# Patient Record
Sex: Female | Born: 1970
Health system: Southern US, Community
[De-identification: ages and names within clinical notes are randomized; demographics above are authoritative.]

## PROBLEM LIST (undated history)

## (undated) DIAGNOSIS — E039 Hypothyroidism, unspecified: Secondary | ICD-10-CM

## (undated) DIAGNOSIS — F32A Depression, unspecified: Secondary | ICD-10-CM

## (undated) DIAGNOSIS — E079 Disorder of thyroid, unspecified: Secondary | ICD-10-CM

## (undated) DIAGNOSIS — S92351B Displaced fracture of fifth metatarsal bone, right foot, initial encounter for open fracture: Secondary | ICD-10-CM

## (undated) DIAGNOSIS — F419 Anxiety disorder, unspecified: Secondary | ICD-10-CM

## (undated) DIAGNOSIS — I10 Essential (primary) hypertension: Secondary | ICD-10-CM

## (undated) DIAGNOSIS — F329 Major depressive disorder, single episode, unspecified: Secondary | ICD-10-CM

## (undated) DIAGNOSIS — G4730 Sleep apnea, unspecified: Secondary | ICD-10-CM

## (undated) DIAGNOSIS — K219 Gastro-esophageal reflux disease without esophagitis: Secondary | ICD-10-CM

## (undated) HISTORY — PX: FOOT SURGERY: SHX648

## (undated) HISTORY — PX: SINUS IRRIGATION: SHX2411

## (undated) HISTORY — PX: LAPAROSCOPIC GASTRIC SLEEVE RESECTION: SHX5895

## (undated) HISTORY — PX: ABDOMINAL HYSTERECTOMY: SHX81

---

## 2016-01-21 NOTE — Progress Notes (Signed)
Surgical Weight Loss Program Consult Prep      Patient: Krystal Acevedo MRN: [id number]  SSN: [id number]    Date of Birth: [birth year]  Age: 45 y.o.  Sex: female      Need to address:  [facility]   Seeking sleeve gastrectomy procedure  Psychological Evaluation:   Krystal Leatherwood PhD. 12-22-15  Labs completed 04-09-15  6 month diet completed with PCP  She has an Ideal body weight of 155 lbs.      PCP: Boone Master , [place] and  requirements:  [facility], all requirements already met      The patient is followed by No primary care provider on file. for problems including:  Morbid Obesity  Sleep Apnea-yes uses CPAP-yes     Hypertension-yes How many medications-1  Hypothyroidism  Anxiety, panic attacks        PRIOR WEIGHT LOSS ATTEMPTS:  Phentermine, Weight Watchers, Atkins, MD supervised    EXERCISE ASSESSMENT:  Walking some    DENTAL ISSUES:  No issues with chewing food, one back right upper tooth is broken.    PSYCHOSOCIAL:  She notes she  is married and states main support person is her husband, Krystal Acevedo.  She is employed as a Therapist, music.  Her goal in pursuing surgical weight loss is to improve health.  She  reports appropriate treatment of anxiety.  She states she is independent in her care, can drive a car, and can ambulate without assistance.      HISTORY:  No past medical history on file.    She denies personal or family history of problems with anesthesia, bleeding, or clotting.  She denies history of DVT or pulmonary embolism.  She denies reflux or dysphagia.      No past surgical history on file.  Social History   Substance Use Topics   ??? Smoking status: Not on file   ??? Smokeless tobacco: Not on file   ??? Alcohol use Not on file     No family history on file.     MEDICATIONS:  No current outpatient prescriptions on file.     No current facility-administered medications for this visit.        ALLERGIES:  Allergies not on file        PLAN:    Consult for surgery.             New Patient PREOP Co-Morbid Condition Evaluation  Surgeon Ouida Sills      GENERAL   Diabetes Mellitus         No   Current Smoker w/in 1 year no   Functional Health Status Prior to Surgery Independent   PULMONARY   History of Severe COPD no   Oxygen Dependent no   History of Pulmonary Embolism no   Diagnosis of Obstructive Sleep Apnea yes   Req. CPAP / BiPAP yes   GASTROINTESTINAL   GERD req. Medications (w/in 30 days prior to surgery) no   MUSCULOSKELETAL   Is the patient's ambulation limited most or all of the time? no   Osteoarthritis req. medication no   CARDIAC   History of Myocardial Infarction no   Previous PCI / PTCA (Cardiac Cath / Angioplasty) no   Previous Cardiac Surgery no   Hypertension requiring medications yes   # of anti-hypertensive meds 1   Hyperlipidemia requiring medication no   VASCULAR   Vein Thrombosis req. Therapy no   Venous Stasis no   RENAL   Currently  requiring or on Dialysis no   Renal Insufficiency no   NUTRITIONAL / IMMUNE / ONCOLOGY / OTHER   Steroid / Immunosuppressant use for Chronic Condition no   Therapeutic Anticoagulation no   Previous Obesity Surgery / Foregut Surgery no       NOTES:

## 2016-02-02 ENCOUNTER — Ambulatory Visit: Admit: 2016-02-02 | Discharge: 2016-02-02 | Payer: PRIVATE HEALTH INSURANCE | Attending: Surgery | Primary: Surgery

## 2016-02-02 ENCOUNTER — Ambulatory Visit: Admit: 2016-02-02 | Discharge: 2016-02-02 | Primary: Surgery

## 2016-02-02 DIAGNOSIS — Z6841 Body Mass Index (BMI) 40.0 and over, adult: Secondary | ICD-10-CM

## 2016-02-02 DIAGNOSIS — Z713 Dietary counseling and surveillance: Secondary | ICD-10-CM
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
## 2016-02-02 NOTE — H&P (View-Only) (Signed)
History and Physical    Patient: Krystal Acevedo MRN: [id number]  SSN: [id number]    Date of Birth: 12-01-1970  Age: 45 y.o.  Sex: female              PCP:  Boone Master , MD  Date:  02/02/2016        Krystal Acevedo presents to the [facility] after successful completion of a multidisciplinary surgical prep program.  This is her consultation preparing her for sleeve gastrectomy surgery.  She is unaccompanied.  She presents with a height of 5' 5"  (1.651 m) and weight of 320 lb (145.2 kg), giving her a Body mass index is 53.25 kg/(m^2).[name]  She has an Ideal body weight of 155 lbs, and excess body weight of 165 lbs.       She has completed 6 months of nutrition, diet, exercise, and behavior modification with her PCP. She has completed all aspects of our prep program. She has also completed a psychological evaluation with Elberta Leatherwood PhD. 12-22-15, and an evaluation with our dietitian, Katie Horner, both deeming her an appropriate surgical candidate.  She completed our preoperative bariatric labs on 04-09-15, scanned in the Media tab.    We have reviewed the procedure again today and completed our standard pre op teaching. Her questions were answered and she is ready to schedule surgery.    We have discussed the procedure, diet and exercise regimens, and potential complications of surgery.  The patient understands the nature and potential complication of surgery and has the capacity to follow the post operative diet, exercise, and nutritional requirements.  After review, she has signed her surgical consent today.      PCP: Boone Master , MD  ??  Insurance company and requirements: [facility], all requirements already met  ??  ??  The patient is followed by No primary care provider on file. for problems including:  Morbid Obesity  Sleep Apnea-yes uses CPAP-yes   Hypertension-yes How many medications-1  Hypothyroidism  Anxiety, panic attacks  Depression  ??  ??She completed 6 months with [facility] Bariatric program with Dr. Melburn Popper.   Her insurance changed and she was not able to have surgery with their facility and is using the [facility] option through her insurance.   PRIOR WEIGHT LOSS ATTEMPTS: Phentermine, Weight Watchers, Orvan Seen, MD supervised  ??  EXERCISE ASSESSMENT: Walking some  ??  DENTAL ISSUES:  No issues with chewing food, one back right upper tooth is broken.  ??  PSYCHOSOCIAL:  She notes she is married and states main support person is her husband, [name].  He is very supportive and is also starting in the process to come to our program for SWL.   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  She reports appropriate treatment of anxiety. She states she is independent in her care, can drive a car, and can ambulate without assistance.  ??  ??  HISTORY:  Past Medical History:   Diagnosis Date   ??? Anxiety    ??? Depression    ??? History of MRSA infection    ??? Hypertension    ??? Hypothyroidism    ??? Sleep apnea        She denies personal or family history of problems with anesthesia, bleeding, or clotting.  She denies history of DVT or pulmonary embolism.  She denies reflux or dysphagia.      Past Surgical History:   Procedure Laterality  Date   ??? HX GYN      C-Sect 1996 and 2001   ??? HX HEENT      sinus surgery 2015   ??? HX HYSTERECTOMY      2007   ??? HX ORTHOPAEDIC      knee surgery 2011, toe surgery 203     Social History   Substance Use Topics   ??? Smoking status: Never Smoker   ??? Smokeless tobacco: Not on file   ??? Alcohol use 1.2 oz/week     2 Glasses of wine per week     Family History   Problem Relation Age of Onset   ??? Diabetes Maternal Grandmother    ??? Hypertension Maternal Grandmother         MEDICATIONS:  Current Outpatient Prescriptions   Medication Sig   ??? busPIRone (BUSPAR) 5 mg tablet Take  by mouth.   ??? hydroCHLOROthiazide (HYDRODIURIL) 25 mg tablet Take 25 mg by mouth daily.   ??? levothyroxine (SYNTHROID) 50 mcg tablet Take  by mouth Daily (before breakfast).    ??? venlafaxine-SR (EFFEXOR XR) 75 mg capsule Take  by mouth daily.   ??? clonazePAM (KLONOPIN) 0.5 mg tablet Take  by mouth nightly as needed.     No current facility-administered medications for this visit.        ALLERGIES:  Allergies   Allergen Reactions   ??? Sulfa (Sulfonamide Antibiotics) Hives       ROS: The patient has no difficulty with chest pain or shortness of breath.  No fever or chills.  Comprehensive 13 point review of systems was otherwise unremarkable except as noted above.    PHYSICAL EXAM:   Visit Vitals   ??? BP 125/61   ??? Pulse 81   ??? Ht 5' 5"  (1.651 m)   ??? Wt 320 lb (145.2 kg)   ??? BMI 53.25 kg/m2       General: Alert oriented cooperative patient in no acute distress.   Eyes: Sclera are clear. Conjunctiva and lids within normal limits. No icterus.  Ears and Nose: no gross deformities to visual inspection, gross hearing intact  Neck: Supple, trachea midline, no appreciable thyromegaly  Resp: No JVD.  Breathing is  non-labored. Lungs clear to auscultation without wheezing or rhonchi   CV: RRR. No murmurs, rubs or gallops appreciated, Carotid bruits are absent  Abd: soft non-tender and non-distended without peritoneal signs. +bs    Psych:  Mood and affect appropriate.  Short-term memory and understanding intact      ASSESSMENT:  Morbid obesity with a  Body mass index is 53.25 kg/(m^2). ready for sleeve gastrectomy surgery.    PLAN:  1.  Schedule for laparoscopic, possible open, sleeve gastrectomy, in the near future.  2.  Patient will complete our 2 week pre operative diet.  3.  Bring CPAP and incentive spirometer( given with our standard instruction to use BID 2 weeks prior to surgery) to hospital on day of surgery.  4.  The patient received prescription to use after surgery for :  Percocet, Zofran, and Protonix.    I went over the risks and benefits with the patient. For risks I went over problems with bleeding, infection and anesthesia. I also noted potential  problems of injury to the esophagus, liver, spleen, stomach, pancreas, small bowel and or colon. I addition, I discussed potential problems of DVT/pulmonary embolism, urinary tract infection, wound infection, myocardial infarction, stroke and the potential need to convert to an open procedure. I quoted  a 1% nationwide mortality rate for this procedure. She has signed our extensive consent form which is in the chart.       Fredna Dow, MD    Date:  02/02/2016

## 2016-02-02 NOTE — Progress Notes (Signed)
History and Physical    Patient: Krystal Acevedo MRN: [id number]  SSN: [id number]    Date of Birth: April 14, 1971  Age: 45 y.o.  Sex: female              PCP:  Boone Master , MD  Date:  02/02/2016        Krystal Acevedo presents to the [facility] after successful completion of a multidisciplinary surgical prep program.  This is her consultation preparing her for sleeve gastrectomy surgery.  She is unaccompanied.  She presents with a height of 5' 5"  (1.651 m) and weight of 320 lb (145.2 kg), giving her a Body mass index is 53.25 kg/(m^2).[name]  She has an Ideal body weight of 155 lbs, and excess body weight of 165 lbs.       She has completed 6 months of nutrition, diet, exercise, and behavior modification with her PCP. She has completed all aspects of our prep program. She has also completed a psychological evaluation with Elberta Leatherwood PhD. 12-22-15, and an evaluation with our dietitian, Katie Horner, both deeming her an appropriate surgical candidate.  She completed our preoperative bariatric labs on 04-09-15, scanned in the Media tab.    We have reviewed the procedure again today and completed our standard pre op teaching. Her questions were answered and she is ready to schedule surgery.    We have discussed the procedure, diet and exercise regimens, and potential complications of surgery.  The patient understands the nature and potential complication of surgery and has the capacity to follow the post operative diet, exercise, and nutritional requirements.  After review, she has signed her surgical consent today.      PCP: Boone Master , MD  ??  Insurance company and requirements: [facility], all requirements already met  ??  ??  The patient is followed by No primary care provider on file. for problems including:  Morbid Obesity  Sleep Apnea-yes uses CPAP-yes   Hypertension-yes How many medications-1  Hypothyroidism  Anxiety, panic attacks  Depression  ??  ??She completed 6 months with [facility] Bariatric program with Dr. Melburn Popper.   Her insurance changed and she was not able to have surgery with their facility and is using the [place] option through her insurance.   PRIOR WEIGHT LOSS ATTEMPTS: Phentermine, Weight Watchers, Orvan Seen, MD supervised  ??  EXERCISE ASSESSMENT: Walking some  ??  DENTAL ISSUES:  No issues with chewing food, one back right upper tooth is broken.  ??  PSYCHOSOCIAL:  She notes she is married and states main support person is her husband, [name].  He is very supportive and is also starting in the process to come to our program for SWL.   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  She reports appropriate treatment of anxiety. She states she is independent in her care, can drive a car, and can ambulate without assistance.  ??  ??  HISTORY:  Past Medical History:   Diagnosis Date   ??? Anxiety    ??? Depression    ??? History of MRSA infection    ??? Hypertension    ??? Hypothyroidism    ??? Sleep apnea        She denies personal or family history of problems with anesthesia, bleeding, or clotting.  She denies history of DVT or pulmonary embolism.  She denies reflux or dysphagia.      Past Surgical History:   Procedure Laterality  Date   ??? HX GYN      C-Sect 1996 and 2001   ??? HX HEENT      sinus surgery 2015   ??? HX HYSTERECTOMY      2007   ??? HX ORTHOPAEDIC      knee surgery 2011, toe surgery 203     Social History   Substance Use Topics   ??? Smoking status: Never Smoker   ??? Smokeless tobacco: Not on file   ??? Alcohol use 1.2 oz/week     2 Glasses of wine per week     Family History   Problem Relation Age of Onset   ??? Diabetes Maternal Grandmother    ??? Hypertension Maternal Grandmother         MEDICATIONS:  Current Outpatient Prescriptions   Medication Sig   ??? busPIRone (BUSPAR) 5 mg tablet Take  by mouth.   ??? hydroCHLOROthiazide (HYDRODIURIL) 25 mg tablet Take 25 mg by mouth daily.   ??? levothyroxine (SYNTHROID) 50 mcg tablet Take  by mouth Daily (before breakfast).    ??? venlafaxine-SR (EFFEXOR XR) 75 mg capsule Take  by mouth daily.   ??? clonazePAM (KLONOPIN) 0.5 mg tablet Take  by mouth nightly as needed.     No current facility-administered medications for this visit.        ALLERGIES:  Allergies   Allergen Reactions   ??? Sulfa (Sulfonamide Antibiotics) Hives       ROS: The patient has no difficulty with chest pain or shortness of breath.  No fever or chills.  Comprehensive 13 point review of systems was otherwise unremarkable except as noted above.    PHYSICAL EXAM:   Visit Vitals   ??? BP 125/61   ??? Pulse 81   ??? Ht 5' 5"  (1.651 m)   ??? Wt 320 lb (145.2 kg)   ??? BMI 53.25 kg/m2       General: Alert oriented cooperative patient in no acute distress.   Eyes: Sclera are clear. Conjunctiva and lids within normal limits. No icterus.  Ears and Nose: no gross deformities to visual inspection, gross hearing intact  Neck: Supple, trachea midline, no appreciable thyromegaly  Resp: No JVD.  Breathing is  non-labored. Lungs clear to auscultation without wheezing or rhonchi   CV: RRR. No murmurs, rubs or gallops appreciated, Carotid bruits are absent  Abd: soft non-tender and non-distended without peritoneal signs. +bs    Psych:  Mood and affect appropriate.  Short-term memory and understanding intact      ASSESSMENT:  Morbid obesity with a  Body mass index is 53.25 kg/(m^2). ready for sleeve gastrectomy surgery.    PLAN:  1.  Schedule for laparoscopic, possible open, sleeve gastrectomy, in the near future.  2.  Patient will complete our 2 week pre operative diet.  3.  Bring CPAP and incentive spirometer( given with our standard instruction to use BID 2 weeks prior to surgery) to hospital on day of surgery.  4.  The patient received prescription to use after surgery for :  Percocet, Zofran, and Protonix.    I went over the risks and benefits with the patient. For risks I went over problems with bleeding, infection and anesthesia. I also noted potential  problems of injury to the esophagus, liver, spleen, stomach, pancreas, small bowel and or colon. I addition, I discussed potential problems of DVT/pulmonary embolism, urinary tract infection, wound infection, myocardial infarction, stroke and the potential need to convert to an open procedure. I quoted  a 1% nationwide mortality rate for this procedure. She has signed our extensive consent form which is in the chart.       Fredna Dow, MD    Date:  02/02/2016

## 2016-02-11 ENCOUNTER — Encounter: Attending: Surgery | Primary: Surgery

## 2016-02-11 ENCOUNTER — Encounter: Primary: Surgery

## 2016-02-12 ENCOUNTER — Inpatient Hospital Stay: Admit: 2016-02-12 | Payer: PRIVATE HEALTH INSURANCE | Attending: Surgery | Primary: Surgery

## 2016-02-12 DIAGNOSIS — Z01812 Encounter for preprocedural laboratory examination: Secondary | ICD-10-CM

## 2016-02-12 LAB — METABOLIC PANEL, COMPREHENSIVE
A-G Ratio: 0.9 — ABNORMAL LOW (ref 1.2–3.5)
ALT (SGPT): 42 U/L (ref 12–65)
AST (SGOT): 21 U/L (ref 15–37)
Albumin: 3.7 g/dL (ref 3.5–5.0)
Alk. phosphatase: 85 U/L (ref 50–136)
Anion gap: 8 mmol/L (ref 7–16)
BUN: 22 MG/DL (ref 6–23)
Bilirubin, total: 0.5 MG/DL (ref 0.2–1.1)
CO2: 29 mmol/L (ref 21–32)
Calcium: 9.1 MG/DL (ref 8.3–10.4)
Chloride: 103 mmol/L (ref 98–107)
Creatinine: 1.08 MG/DL — ABNORMAL HIGH (ref 0.6–1.0)
GFR est AA: >60 mL/min/{1.73_m2} (ref >60)
GFR est non-AA: 58 mL/min/{1.73_m2} — ABNORMAL LOW (ref >60)
Globulin: 3.9 g/dL — ABNORMAL HIGH (ref 2.3–3.5)
Glucose: 91 mg/dL (ref 65–100)
Potassium: 4.4 mmol/L (ref 3.5–5.1)
Protein, total: 7.6 g/dL (ref 6.3–8.2)
Sodium: 140 mmol/L (ref 136–145)

## 2016-02-12 LAB — CBC W/O DIFF
HCT: 42.5 % (ref 35.8–46.3)
HGB: 14 g/dL (ref 11.7–15.4)
MCH: 30.2 PG (ref 26.1–32.9)
MCHC: 32.9 g/dL (ref 31.4–35.0)
MCV: 91.8 FL (ref 79.6–97.8)
MPV: 9.8 FL — ABNORMAL LOW (ref 10.8–14.1)
PLATELET: 297 10*3/uL (ref 150–450)
RBC: 4.63 M/uL (ref 4.05–5.25)
RDW: 14.6 % (ref 11.9–14.6)
WBC: 7.9 10*3/uL (ref 4.3–11.1)

## 2016-02-12 NOTE — Other (Signed)
Lab results are within limits per anesthesia protocol.

## 2016-02-12 NOTE — Other (Signed)
Patient verified name, DOB, and surgery as listed in [facility].    Dr Carollee Leitz, anesthesiologist notified of heart murmur heard.  He met with pt and listened to heart sounds.  He reported to pt that  murmur is mild, no need for echo prior to surgery.    Type 2 surgery, PAT assessment complete.    Labs per surgeon: cbc,cmp; results pending.    EKG: not doone today per anesthesia guidelines.    Hibiclens and instructions given per hospital policy.    Patient provided with handouts including Guide to Surgery, Pain Management, Hand Hygiene, Blood Transfusion Education, and Corporate treasurer.    Patient answered medical/surgical history questions at their best of ability. All prior to admission medications documented in [facility]. Original medication prescription bottle not visualized during patient appointment.     Patient instructed to hold all vitamins 7 days prior to surgery and NSAIDS 5 days prior to surgery, patient verbalized understanding. Medications to be held : none.    Patient instructed to continue previous medications as prescribed prior to surgery and to take the following medications the day of surgery according to anesthesia guidelines with a small sip of water: clonazepam if needed, levothyroxine, buspar, effexor.     Patient taught back and verbalized understanding.

## 2016-02-12 NOTE — Anesthesia Pre-Procedure Evaluation (Signed)
Anesthetic History               Review of Systems / Medical History  Patient summary reviewed, nursing notes reviewed and pertinent labs reviewed    Pulmonary        Sleep apnea: CPAP           Neuro/Psych              Cardiovascular    Hypertension: well controlled              Exercise tolerance:: Walks up and down stairwell to 3rd floor apartment multiple times daily w/o symptoms other than moderate SOB, which resolves quickly     GI/Hepatic/Renal                Endo/Other      Hypothyroidism: well controlled  Morbid obesity     Other Findings              Physical Exam    Airway  Mallampati: I  TM Distance: 4 - 6 cm  Neck ROM: normal range of motion   Mouth opening: Normal     Cardiovascular    Rhythm: regular  Rate: normal    Murmur: Grade 2, Aortic area     Dental  No notable dental hx    Comments: 1 capped upper incisor   Pulmonary  Breath sounds clear to auscultation               Abdominal  GI exam deferred       Other Findings            Anesthetic Plan    ASA: 3  Anesthesia type: general            Anesthetic plan and risks discussed with: Patient and Spouse      In light of pt's excellent FS (despite morbid obesity), I feel there is no compelling indication to investigate cardiac murmur further at this time.

## 2016-02-22 ENCOUNTER — Inpatient Hospital Stay
Admit: 2016-02-22 | Discharge: 2016-02-24 | Disposition: A | Discharge status: Home / Self Care | Payer: PRIVATE HEALTH INSURANCE | Source: Ambulatory Visit | Attending: Surgery | Admitting: Surgery

## 2016-02-22 MED ORDER — SODIUM CHLORIDE 0.9 % IJ SYRG
INTRAMUSCULAR | Status: DC | PRN
Start: 2016-02-22 — End: 2016-02-22

## 2016-02-22 MED ORDER — WHITE PETROLATUM TOPICAL GEL
CUTANEOUS | Status: AC
Start: 2016-02-22 — End: ?

## 2016-02-22 MED ORDER — BUPIVACAINE (PF) 0.5 % (5 MG/ML) IJ SOLN
0.5 % (5 mg/mL) | INTRAMUSCULAR | Status: DC | PRN
Start: 2016-02-22 — End: 2016-02-22
  Administered 2016-02-22: 16:00:00 via SUBCUTANEOUS

## 2016-02-22 MED ORDER — DEXTROSE 50% IN WATER (D50W) IV SYRG
INTRAVENOUS | Status: DC | PRN
Start: 2016-02-22 — End: 2016-02-24

## 2016-02-22 MED ORDER — FENTANYL CITRATE (PF) 50 MCG/ML IJ SOLN
50 mcg/mL | Freq: Once | INTRAMUSCULAR | Status: DC
Start: 2016-02-22 — End: 2016-02-22

## 2016-02-22 MED ORDER — BUPIVACAINE (PF) 0.5 % (5 MG/ML) IJ SOLN
0.5 % (5 mg/mL) | INTRAMUSCULAR | Status: AC
Start: 2016-02-22 — End: ?

## 2016-02-22 MED ORDER — MIDAZOLAM 1 MG/ML IJ SOLN
1 mg/mL | INTRAMUSCULAR | Status: AC
Start: 2016-02-22 — End: ?

## 2016-02-22 MED ORDER — DEXAMETHASONE SODIUM PHOSPHATE 10 MG/ML IJ SOLN
10 mg/mL | INTRAMUSCULAR | Status: AC
Start: 2016-02-22 — End: ?

## 2016-02-22 MED ORDER — KETOROLAC TROMETHAMINE 30 MG/ML INJECTION
30 mg/mL (1 mL) | INTRAMUSCULAR | Status: AC
Start: 2016-02-22 — End: ?

## 2016-02-22 MED ORDER — LIDOCAINE (PF) 20 MG/ML (2 %) IV SYRINGE
100 mg/5 mL (2 %) | INTRAVENOUS | Status: AC
Start: 2016-02-22 — End: ?

## 2016-02-22 MED ORDER — VECURONIUM BROMIDE 10 MG IV SOLR
10 mg | INTRAVENOUS | Status: DC | PRN
Start: 2016-02-22 — End: 2016-02-22
  Administered 2016-02-22: 15:00:00 via INTRAVENOUS

## 2016-02-22 MED ORDER — SODIUM CHLORIDE 0.9% BOLUS IV
0.9 % | INTRAVENOUS | Status: DC | PRN
Start: 2016-02-22 — End: 2016-02-24

## 2016-02-22 MED ORDER — ONDANSETRON (PF) 4 MG/2 ML INJECTION
4 mg/2 mL | INTRAMUSCULAR | Status: DC | PRN
Start: 2016-02-22 — End: 2016-02-22
  Administered 2016-02-22: 16:00:00 via INTRAVENOUS

## 2016-02-22 MED ORDER — PANTOPRAZOLE 40 MG IV SOLR
40 mg | INTRAVENOUS | Status: DC
Start: 2016-02-22 — End: 2016-02-23
  Administered 2016-02-22: 18:00:00 via INTRAVENOUS

## 2016-02-22 MED ORDER — NS WITH POTASSIUM CHLORIDE 20 MEQ/L IV
20 mEq/L | INTRAVENOUS | Status: DC
Start: 2016-02-22 — End: 2016-02-24
  Administered 2016-02-22 – 2016-02-24 (×4): via INTRAVENOUS

## 2016-02-22 MED ORDER — LIDOCAINE (PF) 20 MG/ML (2 %) IJ SOLN
20 mg/mL (2 %) | INTRAMUSCULAR | Status: DC | PRN
Start: 2016-02-22 — End: 2016-02-22
  Administered 2016-02-22: 15:00:00 via INTRAVENOUS

## 2016-02-22 MED ORDER — LACTATED RINGERS IV
INTRAVENOUS | Status: DC
Start: 2016-02-22 — End: 2016-02-22
  Administered 2016-02-22 (×2): via INTRAVENOUS

## 2016-02-22 MED ORDER — GLYCOPYRROLATE 0.2 MG/ML IJ SOLN
0.2 mg/mL | INTRAMUSCULAR | Status: AC
Start: 2016-02-22 — End: ?

## 2016-02-22 MED ORDER — ENOXAPARIN 40 MG/0.4 ML SUB-Q SYRINGE
40 mg/0.4 mL | Freq: Once | SUBCUTANEOUS | Status: AC
Start: 2016-02-22 — End: 2016-02-22
  Administered 2016-02-22: 14:00:00 via SUBCUTANEOUS

## 2016-02-22 MED ORDER — VECURONIUM BROMIDE 10 MG IV SOLR
10 mg | INTRAVENOUS | Status: AC
Start: 2016-02-22 — End: ?

## 2016-02-22 MED ORDER — NALOXONE 0.4 MG/ML INJECTION
0.4 mg/mL | INTRAMUSCULAR | Status: DC | PRN
Start: 2016-02-22 — End: 2016-02-24

## 2016-02-22 MED ORDER — SODIUM CHLORIDE 0.9 % IJ SYRG
INTRAMUSCULAR | Status: DC | PRN
Start: 2016-02-22 — End: 2016-02-24

## 2016-02-22 MED ORDER — ENOXAPARIN 40 MG/0.4 ML SUB-Q SYRINGE
40 mg/0.4 mL | Freq: Two times a day (BID) | SUBCUTANEOUS | Status: DC
Start: 2016-02-22 — End: 2016-02-24
  Administered 2016-02-23 – 2016-02-24 (×4): via SUBCUTANEOUS

## 2016-02-22 MED ORDER — SODIUM CHLORIDE 0.9 % IV PIGGY BACK
600 mg/4 mL | Freq: Three times a day (TID) | INTRAVENOUS | Status: AC
Start: 2016-02-22 — End: 2016-02-23
  Administered 2016-02-22 – 2016-02-23 (×2): via INTRAVENOUS

## 2016-02-22 MED ORDER — HYDROMORPHONE (PF) 2 MG/ML IJ SOLN
2 mg/mL | INTRAMUSCULAR | Status: DC | PRN
Start: 2016-02-22 — End: 2016-02-22
  Administered 2016-02-22 (×2): via INTRAVENOUS

## 2016-02-22 MED ORDER — KETOROLAC TROMETHAMINE 30 MG/ML INJECTION
30 mg/mL (1 mL) | Freq: Four times a day (QID) | INTRAMUSCULAR | Status: DC | PRN
Start: 2016-02-22 — End: 2016-02-24

## 2016-02-22 MED ORDER — APREPITANT 40 MG CAP
40 mg | Freq: Once | ORAL | Status: AC
Start: 2016-02-22 — End: 2016-02-22
  Administered 2016-02-22: 14:00:00 via ORAL

## 2016-02-22 MED ORDER — DIPHENHYDRAMINE HCL 50 MG/ML IJ SOLN
50 mg/mL | INTRAMUSCULAR | Status: DC | PRN
Start: 2016-02-22 — End: 2016-02-24

## 2016-02-22 MED ORDER — DEXTROSE 40 % ORAL GEL
40 % | ORAL | Status: DC | PRN
Start: 2016-02-22 — End: 2016-02-24

## 2016-02-22 MED ORDER — SODIUM CHLORIDE 0.9 % INJECTION
25 mg/mL | Freq: Once | INTRAMUSCULAR | Status: AC
Start: 2016-02-22 — End: 2016-02-22
  Administered 2016-02-22: 17:00:00 via INTRAVENOUS

## 2016-02-22 MED ORDER — LIDOCAINE HCL 1 % (10 MG/ML) IJ SOLN
10 mg/mL (1 %) | INTRAMUSCULAR | Status: DC | PRN
Start: 2016-02-22 — End: 2016-02-22
  Administered 2016-02-22: 14:00:00 via SUBCUTANEOUS

## 2016-02-22 MED ORDER — DEXAMETHASONE SODIUM PHOSPHATE 4 MG/ML IJ SOLN
4 mg/mL | INTRAMUSCULAR | Status: DC | PRN
Start: 2016-02-22 — End: 2016-02-22
  Administered 2016-02-22: 16:00:00 via INTRAVENOUS

## 2016-02-22 MED ORDER — ONDANSETRON (PF) 4 MG/2 ML INJECTION
4 mg/2 mL | Freq: Four times a day (QID) | INTRAMUSCULAR | Status: DC | PRN
Start: 2016-02-22 — End: 2016-02-24
  Administered 2016-02-22 – 2016-02-23 (×2): via INTRAVENOUS

## 2016-02-22 MED ORDER — NEOSTIGMINE METHYLSULFATE 3 MG/3 ML (1 MG/ML) IV SYRINGE
3 mg/ mL (1 mg/mL) | INTRAVENOUS | Status: AC
Start: 2016-02-22 — End: ?

## 2016-02-22 MED ORDER — CEFAZOLIN 3G IN 100 ML 0.9% NS PREMIX
3 gram/100ml | Freq: Once | INTRAVENOUS | Status: AC
Start: 2016-02-22 — End: 2016-02-22
  Administered 2016-02-22: 15:00:00 via INTRAVENOUS

## 2016-02-22 MED ORDER — LACTATED RINGERS BOLUS IV
Freq: Once | INTRAVENOUS | Status: DC | PRN
Start: 2016-02-22 — End: 2016-02-22

## 2016-02-22 MED ORDER — LORAZEPAM 2 MG/ML IJ SOLN
2 mg/mL | Freq: Four times a day (QID) | INTRAMUSCULAR | Status: DC | PRN
Start: 2016-02-22 — End: 2016-02-24

## 2016-02-22 MED ORDER — GLUCAGON 1 MG INJECTION
1 mg | INTRAMUSCULAR | Status: DC | PRN
Start: 2016-02-22 — End: 2016-02-24

## 2016-02-22 MED ORDER — ACETAMINOPHEN 1,000 MG/100 ML (10 MG/ML) IV
1000 mg/100 mL (10 mg/mL) | Freq: Four times a day (QID) | INTRAVENOUS | Status: AC
Start: 2016-02-22 — End: 2016-02-23
  Administered 2016-02-22 – 2016-02-23 (×3): via INTRAVENOUS

## 2016-02-22 MED ORDER — FENTANYL CITRATE (PF) 50 MCG/ML IJ SOLN
50 mcg/mL | INTRAMUSCULAR | Status: DC | PRN
Start: 2016-02-22 — End: 2016-02-22
  Administered 2016-02-22 (×2): via INTRAVENOUS

## 2016-02-22 MED ORDER — DIPHENHYDRAMINE HCL 50 MG/ML IJ SOLN
50 mg/mL | Freq: Once | INTRAMUSCULAR | Status: DC
Start: 2016-02-22 — End: 2016-02-22
  Administered 2016-02-22: 17:00:00 via INTRAVENOUS

## 2016-02-22 MED ORDER — ACETAMINOPHEN 1,000 MG/100 ML (10 MG/ML) IV
1000 mg/100 mL (10 mg/mL) | Freq: Once | INTRAVENOUS | Status: AC
Start: 2016-02-22 — End: 2016-02-22
  Administered 2016-02-22: 16:00:00 via INTRAVENOUS

## 2016-02-22 MED ORDER — NEOSTIGMINE METHYLSULFATE 1 MG/ML INJECTION
1 mg/mL | INTRAMUSCULAR | Status: DC | PRN
Start: 2016-02-22 — End: 2016-02-22
  Administered 2016-02-22: 16:00:00 via INTRAVENOUS

## 2016-02-22 MED ORDER — PROPOFOL 10 MG/ML IV EMUL
10 mg/mL | INTRAVENOUS | Status: AC
Start: 2016-02-22 — End: ?

## 2016-02-22 MED ORDER — MIDAZOLAM 1 MG/ML IJ SOLN
1 mg/mL | Freq: Once | INTRAMUSCULAR | Status: AC
Start: 2016-02-22 — End: 2016-02-22
  Administered 2016-02-22: 15:00:00 via INTRAVENOUS

## 2016-02-22 MED ORDER — SODIUM CHLORIDE 0.9 % IV
INTRAVENOUS | Status: DC
Start: 2016-02-22 — End: 2016-02-22

## 2016-02-22 MED ORDER — MIDAZOLAM 1 MG/ML IJ SOLN
1 mg/mL | Freq: Once | INTRAMUSCULAR | Status: DC | PRN
Start: 2016-02-22 — End: 2016-02-22

## 2016-02-22 MED ORDER — GLYCOPYRROLATE 0.2 MG/ML IJ SOLN
0.2 mg/mL | INTRAMUSCULAR | Status: DC | PRN
Start: 2016-02-22 — End: 2016-02-22
  Administered 2016-02-22 (×2): via INTRAVENOUS

## 2016-02-22 MED ORDER — HYDROMORPHONE (PF) 1 MG/ML IJ SOLN
1 mg/mL | INTRAMUSCULAR | Status: DC | PRN
Start: 2016-02-22 — End: 2016-02-24
  Administered 2016-02-22 – 2016-02-24 (×4): via INTRAVENOUS

## 2016-02-22 MED ORDER — PROPOFOL 10 MG/ML IV EMUL
10 mg/mL | INTRAVENOUS | Status: DC | PRN
Start: 2016-02-22 — End: 2016-02-22
  Administered 2016-02-22: 15:00:00 via INTRAVENOUS

## 2016-02-22 MED ORDER — OXYCODONE-ACETAMINOPHEN 5 MG-325 MG TAB
5-325 mg | ORAL | Status: DC | PRN
Start: 2016-02-22 — End: 2016-02-22

## 2016-02-22 MED ORDER — FAMOTIDINE 20 MG TAB
20 mg | Freq: Once | ORAL | Status: AC
Start: 2016-02-22 — End: 2016-02-22
  Administered 2016-02-22: 14:00:00 via ORAL

## 2016-02-22 MED ORDER — HYDROMORPHONE (PF) 1 MG/ML IJ SOLN
1 mg/mL | INTRAMUSCULAR | Status: DC | PRN
Start: 2016-02-22 — End: 2016-02-24
  Administered 2016-02-23: 13:00:00 via INTRAVENOUS

## 2016-02-22 MED ORDER — ENALAPRILAT 1.25 MG/ML IV INJ
1.25 mg/mL | INTRAVENOUS | Status: DC | PRN
Start: 2016-02-22 — End: 2016-02-24

## 2016-02-22 MED ORDER — OXYCODONE 5 MG TAB
5 mg | Freq: Once | ORAL | Status: DC | PRN
Start: 2016-02-22 — End: 2016-02-22

## 2016-02-22 MED ORDER — SODIUM CHLORIDE 0.9 % IJ SYRG
Freq: Three times a day (TID) | INTRAMUSCULAR | Status: DC
Start: 2016-02-22 — End: 2016-02-22

## 2016-02-22 MED ORDER — SODIUM CHLORIDE 0.9 % IJ SYRG
Freq: Three times a day (TID) | INTRAMUSCULAR | Status: DC
Start: 2016-02-22 — End: 2016-02-24
  Administered 2016-02-22 – 2016-02-24 (×6): via INTRAVENOUS

## 2016-02-22 MED ORDER — METOPROLOL TARTRATE 5 MG/5 ML IV SOLN
5 mg/ mL | INTRAVENOUS | Status: DC | PRN
Start: 2016-02-22 — End: 2016-02-24

## 2016-02-22 MED ORDER — KETOROLAC TROMETHAMINE 30 MG/ML INJECTION
30 mg/mL (1 mL) | INTRAMUSCULAR | Status: DC | PRN
Start: 2016-02-22 — End: 2016-02-22
  Administered 2016-02-22: 16:00:00 via INTRAVENOUS

## 2016-02-22 MED ORDER — ONDANSETRON (PF) 4 MG/2 ML INJECTION
4 mg/2 mL | Freq: Once | INTRAMUSCULAR | Status: DC
Start: 2016-02-22 — End: 2016-02-22

## 2016-02-22 MED ORDER — ONDANSETRON (PF) 4 MG/2 ML INJECTION
4 mg/2 mL | INTRAMUSCULAR | Status: AC
Start: 2016-02-22 — End: ?

## 2016-02-22 MED ORDER — VENLAFAXINE SR 37.5 MG 24 HR CAP
37.5 mg | Freq: Every day | ORAL | Status: DC
Start: 2016-02-22 — End: 2016-02-24
  Administered 2016-02-23 – 2016-02-24 (×2): via ORAL

## 2016-02-22 MED ORDER — FENTANYL CITRATE (PF) 50 MCG/ML IJ SOLN
50 mcg/mL | INTRAMUSCULAR | Status: AC
Start: 2016-02-22 — End: ?

## 2016-02-22 MED ORDER — LACTATED RINGERS IV
INTRAVENOUS | Status: DC
Start: 2016-02-22 — End: 2016-02-22
  Administered 2016-02-22: 17:00:00 via INTRAVENOUS

## 2016-02-22 MED FILL — GLYCOPYRROLATE 0.2 MG/ML IJ SOLN: 0.2 mg/mL | INTRAMUSCULAR | Qty: 2

## 2016-02-22 MED FILL — WHITE PETROLATUM TOPICAL GEL: CUTANEOUS | Qty: 30

## 2016-02-22 MED FILL — PROPOFOL 10 MG/ML IV EMUL: 10 mg/mL | INTRAVENOUS | Qty: 200

## 2016-02-22 MED FILL — VECURONIUM BROMIDE 10 MG IV SOLR: 10 mg | INTRAVENOUS | Qty: 8

## 2016-02-22 MED FILL — KETOROLAC TROMETHAMINE 30 MG/ML INJECTION: 30 mg/mL (1 mL) | INTRAMUSCULAR | Qty: 1

## 2016-02-22 MED FILL — HYDROMORPHONE (PF) 1 MG/ML IJ SOLN: 1 mg/mL | INTRAMUSCULAR | Qty: 1

## 2016-02-22 MED FILL — DEXAMETHASONE SODIUM PHOSPHATE 10 MG/ML IJ SOLN: 10 mg/mL | INTRAMUSCULAR | Qty: 1

## 2016-02-22 MED FILL — EMEND 40 MG CAPSULE: 40 mg | ORAL | Qty: 1

## 2016-02-22 MED FILL — NEOSTIGMINE METHYLSULFATE 3 MG/3 ML (1 MG/ML) IV SYRINGE: 3 mg/ mL (1 mg/mL) | INTRAVENOUS | Qty: 3

## 2016-02-22 MED FILL — MIDAZOLAM 1 MG/ML IJ SOLN: 1 mg/mL | INTRAMUSCULAR | Qty: 2

## 2016-02-22 MED FILL — ONDANSETRON (PF) 4 MG/2 ML INJECTION: 4 mg/2 mL | INTRAMUSCULAR | Qty: 2

## 2016-02-22 MED FILL — FENTANYL CITRATE (PF) 50 MCG/ML IJ SOLN: 50 mcg/mL | INTRAMUSCULAR | Qty: 2

## 2016-02-22 MED FILL — VECURONIUM BROMIDE 10 MG IV SOLR: 10 mg | INTRAVENOUS | Qty: 10

## 2016-02-22 MED FILL — CEFAZOLIN 3G IN 100 ML 0.9% NS PREMIX: 3 gram/100ml | INTRAVENOUS | Qty: 100

## 2016-02-22 MED FILL — CLINDAMYCIN 600 MG/4 ML IV: 600 mg/4 mL | INTRAVENOUS | Qty: 4

## 2016-02-22 MED FILL — DEXAMETHASONE SODIUM PHOSPHATE 10 MG/ML IJ SOLN: 10 mg/mL | INTRAMUSCULAR | Qty: 10

## 2016-02-22 MED FILL — HYDROMORPHONE (PF) 2 MG/ML IJ SOLN: 2 mg/mL | INTRAMUSCULAR | Qty: 1

## 2016-02-22 MED FILL — LIDOCAINE (PF) 20 MG/ML (2 %) IV SYRINGE: 100 mg/5 mL (2 %) | INTRAVENOUS | Qty: 5

## 2016-02-22 MED FILL — SENSORCAINE-MPF 0.5 % (5 MG/ML) INJECTION SOLUTION: 0.5 % (5 mg/mL) | INTRAMUSCULAR | Qty: 30

## 2016-02-22 MED FILL — OFIRMEV 1,000 MG/100 ML (10 MG/ML) INTRAVENOUS SOLUTION: 1000 mg/100 mL (10 mg/mL) | INTRAVENOUS | Qty: 100

## 2016-02-22 MED FILL — FAMOTIDINE 20 MG TAB: 20 mg | ORAL | Qty: 1

## 2016-02-22 MED FILL — PROPOFOL 10 MG/ML IV EMUL: 10 mg/mL | INTRAVENOUS | Qty: 20

## 2016-02-22 MED FILL — XYLOCAINE-MPF 20 MG/ML (2 %) INJECTION SOLUTION: 20 mg/mL (2 %) | INTRAMUSCULAR | Qty: 100

## 2016-02-22 MED FILL — LOVENOX 40 MG/0.4 ML SUBCUTANEOUS SYRINGE: 40 mg/0.4 mL | SUBCUTANEOUS | Qty: 0.4

## 2016-02-22 MED FILL — PROMETHAZINE 25 MG/ML INJECTION: 25 mg/mL | INTRAMUSCULAR | Qty: 1

## 2016-02-22 MED FILL — ONDANSETRON (PF) 4 MG/2 ML INJECTION: 4 mg/2 mL | INTRAMUSCULAR | Qty: 4

## 2016-02-22 MED FILL — GLYCOPYRROLATE 0.2 MG/ML IJ SOLN: 0.2 mg/mL | INTRAMUSCULAR | Qty: 0.6

## 2016-02-22 MED FILL — NS WITH POTASSIUM CHLORIDE 20 MEQ/L IV: 20 mEq/L | INTRAVENOUS | Qty: 1000

## 2016-02-22 MED FILL — PROTONIX 40 MG INTRAVENOUS SOLUTION: 40 mg | INTRAVENOUS | Qty: 40

## 2016-02-22 NOTE — Progress Notes (Signed)
Bedside report given to [facility] Wells RN. Pt in bed resting with husband at side. No noted distress.

## 2016-02-22 NOTE — Brief Op Note (Signed)
BRIEF OPERATIVE NOTE    Date of Procedure: 02/22/2016   Preoperative Diagnosis: MORBID OBESITY WITH A BMI OF 53 AND A PERIUMBILICAL NEVUS.  Postoperative Diagnosis: SAME    Procedure(s):  LAPAROSCOPIC SLEEVE GASTRECTOMY AND EXCISION OF A PERIUMBILICUS NEVUS  Surgeon(s) and Role:     * Blake Divineavid G Rivaan Kendall, MD - Primary         Assistant Staff:       Surgical Staff:  Circ-1: Charlynn GrimesJoye Curd, RN  Scrub Tech-1: Alfonso PattenKimberly A Caldwell  Scrub Tech-2: Antony SalmonLorain Truesdale; Julius BowelsMiles J Harrison  Scrub Tech-Relief: Jaci LazierLyshon S Lanteon  Scrub RN-Relief: Cranford MonAshley B Richards, RN  Event Time In   Incision Start 1133   Incision Close      Anesthesia: General plus local  Estimated Blood Loss: 25 cc's  Specimens:   ID Type Source Tests Collected by Time Destination   1 : periumbilical nevus Fresh Umbilicus  Blake Divineavid G Nolita Kutter, MD 02/22/2016 1134 Pathology   2 : portion of stomach Fresh Stomach  Blake Divineavid G Jaziel Bennett, MD 02/22/2016 1211 Pathology      Findings: See dictated note.  Complications: None.  Implants: * No implants in log *

## 2016-02-22 NOTE — Progress Notes (Signed)
Bedside shift report received from previous nurse.  Pt has no needs or complaints at this time.

## 2016-02-22 NOTE — Other (Signed)
TRANSFER - OUT REPORT:    Verbal report given to Kandee KeenHeather Stansell, RN on Krystal Hasana S Acevedo  being transferred to 3rd floor med surg for routine progression of care       Report consisted of patient???s Situation, Background, Assessment and   Recommendations(SBAR).     Information from the following report(s) OR Summary, Procedure Summary, Intake/Output and MAR was reviewed with the receiving nurse.    Lines:   Peripheral IV 02/22/16 Left Hand (Active)   Site Assessment Clean, dry, & intact 02/22/2016  1:35 PM   Phlebitis Assessment 0 02/22/2016  1:35 PM   Infiltration Assessment 0 02/22/2016  1:35 PM   Dressing Status Clean, dry, & intact 02/22/2016  1:35 PM   Dressing Type Transparent 02/22/2016  1:35 PM   Hub Color/Line Status Infusing 02/22/2016  1:35 PM        Opportunity for questions and clarification was provided.      Patient transported with:   O2 @ 3 liters

## 2016-02-22 NOTE — Anesthesia Post-Procedure Evaluation (Signed)
Post-Anesthesia Evaluation and Assessment    Patient: Krystal Acevedo S Jordahl MRN: [id number]  SSN: [id number]    Date of Birth: 1971-07-25  Age: 45 y.o.  Sex: female       Cardiovascular Function/Vital Signs  Visit Vitals   ??? BP 156/74   ??? Pulse 67   ??? Temp 36.6 ??C (97.8 ??F)   ??? Resp 16   ??? Wt 137.5 kg (303 lb 2 oz)   ??? SpO2 98%   ??? BMI 50.44 kg/m2       Patient is status post general anesthesia for Procedure(s):  LAPAROSCOPIC GASTRECTOMY SLEEVE; EXCISION OF PERIUMBILICUS NEVUS.    Nausea/Vomiting: None    Postoperative hydration reviewed and adequate.    Pain:  Pain Scale 1: Visual (02/22/16 1321)  Pain Intensity 1: 0 (02/22/16 1321)   Managed    Neurological Status:   Neuro (WDL): Exceptions to WDL (02/22/16 1321)  Neuro  Neurologic State: Drowsy (02/22/16 1321)   At baseline    Mental Status and Level of Consciousness: Arousable    Pulmonary Status:   O2 Device: Nasal cannula (02/22/16 1321)   Adequate oxygenation and airway patent    Complications related to anesthesia: None    Post-anesthesia assessment completed. No concerns    Signed By: Thomes LollingAdele Susan Heydy Montilla, MD     February 22, 2016

## 2016-02-22 NOTE — Interval H&P Note (Signed)
H&P Update:  Krystal Acevedo was seen and examined.  History and physical has been reviewed. The patient has been examined. There have been no significant clinical changes since the completion of the originally dated History and Physical.    Signed By: Blake Divineavid G Hulbert Branscome, MD     February 22, 2016 8:48 AM

## 2016-02-22 NOTE — Progress Notes (Signed)
zofran 4 mg SIVP given at this time per pt request for nausea.

## 2016-02-22 NOTE — Progress Notes (Signed)
Pt admitted to floor from PACU, pt alert and oriented. Pt with 6 PS to abdomen all well approximated with steri strips--abdomen soft, tender, BS hypoactive. Pt oriented to room and bed functions--pt verbalized understanding of teaching. Foley cath removed in PACU and pt has not voided. Bed L/L, call bell is within reach and side rails are up x 2. Pt with nausea-- will medicate.

## 2016-02-22 NOTE — Op Note (Signed)
[facility]   OPERATIVE REPORT       Name:  Krystal NighHALL, Sammy   MR#:  [id number]   DOB:  18-Jan-1971   Account #:  [id number]   Date of Adm:  02/22/2016       DATE OF PROCEDURE: 02/22/2016    PREOPERATIVE DIAGNOSES: Morbid obesity with a body mass index of   53, as well as abnormal nevus in the periumbilical region.    PROCEDURE: Laparoscopic sleeve gastrectomy, excision of a   periumbilical nevus.    SURGEON: Paulino Dooravid Gordon Archie Shea, MD    ANESTHESIA: General endotracheal anesthesia with Dr. Langley GaussLynagh and   Lurena Joinerebecca, the CRNA.     ESTIMATED BLOOD LOSS: 25 mL.    SPECIMENS:   1. Periumbilical nevus.   2. Segment of stomach sent to Pathology.    HISTORY: This is a 45 year old female who came to our office for   consideration of weight loss surgery. She went through our   entire program, meeting with a psychologist, and dietitian and   completing all the requirements successfully. I discussed a   sleeve gastrectomy with her, going through the risks of   bleeding, infection, anesthesia, injury to the esophagus,   stomach, liver, spleen, small bowel, large bowel, pancreas, and   a potential need to convert from a laparoscopic procedure to an   open procedure. I also discussed risks of a leak along the   staple line which could require further surgery and/or an   internal stent placement. Other potential risks, DVT, pulmonary   emboli, myocardial infarction, pneumonia, stroke, wound   infections, incisional hernia, were all discussed. The patient   agreed, signed a consent form, and was scheduled for today. In   the preoperative area, I came in to discuss this with the   patient. She said she had a "ugly mole" near her umbilicus and   asked to have it excised while she was under general anesthesia.   I agreed and we added it to the consent form.    DESCRIPTION OF PROCEDURE: The [facility] then [facility], placed on the   operating table in supine position. General  endotracheal   anesthesia was administered without complication. She received 3   grams of Ancef as prophylactic antibiotic coverage. She had a   Foley catheter inserted and this was taken out in the postop   recovery room. Sequential compression devices were placed and   used throughout the procedure. A timeout was done identifying   the patient, surgeon, procedure and her birthday of 18-Jan-1971.   Once everyone in the room agreed with the time-out, I began the   procedure. I made an incision around the nevus to the right side   of her umbilicus. It was an oval type incision made with a 15   scalpel blade. The lesion just contained skin and a small amount   of subcutaneous tissue was excised and sent to pathology for   evaluation. The defect was closed with two 3-0 vertical mattress   sutures of 3-0 nylon. We then began the sleeve gastrectomy   portion of the procedure. I made a transverse incision to the   left and superior to the umbilicus and through this incision,   which I made with a separate 15 scalpel blade, I placed an   Optiview trocar. I went through all the appropriate layers of   the anterior abdominal wall until  the peritoneal cavity was   entered. Peritoneal cavity was insufflated to 15 mmHg using   carbon dioxide gas after which a 10 mm 30 degree scope was   inserted. I placed a 5 mm and 15 mm trocars to the right side of   the midline in the right upper quadrant area. A 5 mm trocar was   placed in the epigastric region, was removed and a Nathanson   liver retractor was placed and used to retract the left lobe of   liver throughout the remainder of the procedure. The Satira Mccallum   was attached to an Omni self-retaining retracting device that   was attached to the bed rail on the left hand side of the   patient. A 5 mm trocar was placed in left upper quadrant. I   marked off an area 6 cm proximal to the pylorus and made an   aperture in greater curvature with the EnSeal Trio device. We   then took down  the entire greater curvature from 6 cm proximal   to the pylorus all the way up to the angle of His and the left   crus of the diaphragm. Once this had been done, an orogastric   tube that had been placed by the nurse anesthetist was removed   and a calibration tube, 38-French in diameter, was then placed   along the lesser curvature. We placed the stapling device   against this and created a sleeve out of the lesser curvature of   the stomach. The first staple cartridge was black, then green   and then 3 blue staple cartridges were used. The greater   curvature portion of the stomach was removed with all   attachments. The calibration tube was slowly withdrawn. We   checked for evidence of bleeding along the staple line. Some   small bleeding points were cauterized with electrocautery and   the hook. Once there was no further bleeding noted, and the   calibration tube was removed from the body, I placed a suture of   0 Ethibond on the stomach to be excised and this marked it and   helped allow the removal of this segment of stomach. It was   placed into a 15-French Endopouch, which was withdrawn through   the 15 mm trocar site without difficulty. The segment of stomach   was removed and sent to pathology for evaluation. We placed a 15   mm trocar in its site and removed the [facility] liver retractor,   we removed all the remaining trocars without evidence of   bleeding from the trocar sites. The Optiview trocar was removed.   I injected 30 mL of 0.5% Marcaine plain in divided doses to the   5 incision sites. I closed the incision sites with subcuticular   sutures of 4-0 Monocryl followed by Dermabond and Steri-Strips.   The patient tolerated the procedure well, was extubated, brought   to recovery room in stable condition.        Ferd Horrigan Deberah Castle, MD      DGA / CLM   D:  02/22/2016   12:37   T:  02/22/2016   14:35   Job #:  [id number]

## 2016-02-22 NOTE — Progress Notes (Signed)
Pt up walking in [place] and tolerating well.

## 2016-02-22 NOTE — Op Note (Signed)
[facility]   OPERATIVE REPORT       Name:  NAREH, MATZKE   MR#:  [id number]   DOB:  [birth year]   Account #:  [id number]   Date of Adm:  02/22/2016       DATE OF PROCEDURE: 02/22/2016    PREOPERATIVE DIAGNOSES: Morbid obesity with a body mass index of   53, as well as abnormal nevus in the periumbilical region.    PROCEDURE: Laparoscopic sleeve gastrectomy, excision of a   periumbilical nevus.    SURGEON: Paulino Door, MD    ANESTHESIA: General endotracheal anesthesia with Dr. Langley Gauss and   Lurena Joiner, the CRNA.     ESTIMATED BLOOD LOSS: 25 mL.    SPECIMENS:   1. Periumbilical nevus.   2. Segment of stomach sent to Pathology.    HISTORY: This is a 45 year old female who came to our office for   consideration of weight loss surgery. She went through our   entire program, meeting with a psychologist, and dietitian and   completing all the requirements successfully. I discussed a   sleeve gastrectomy with her, going through the risks of   bleeding, infection, anesthesia, injury to the esophagus,   stomach, liver, spleen, small bowel, large bowel, pancreas, and   a potential need to convert from a laparoscopic procedure to an   open procedure. I also discussed risks of a leak along the   staple line which could require further surgery and/or an   internal stent placement. Other potential risks, DVT, pulmonary   emboli, myocardial infarction, pneumonia, stroke, wound   infections, incisional hernia, were all discussed. The patient   agreed, signed a consent form, and was scheduled for today. In   the preoperative area, I came in to discuss this with the   patient. She said she had a "ugly mole" near her umbilicus and   asked to have it excised while she was under general anesthesia.   I agreed and we added it to the consent form.    DESCRIPTION OF PROCEDURE: The patient was then transported to   room #7 at [facility]. [facility], placed on the    operating table in supine position. General endotracheal   anesthesia was administered without complication. She received 3   grams of Ancef as prophylactic antibiotic coverage. She had a   Foley catheter inserted and this was taken out in the postop   recovery room. Sequential compression devices were placed and   used throughout the procedure. A timeout was done identifying   the patient, surgeon, procedure and her birthday of 02-27-1971.   Once everyone in the room agreed with the time-out, I began the   procedure. I made an incision around the nevus to the right side   of her umbilicus. It was an oval type incision made with a 15   scalpel blade. The lesion just contained skin and a small amount   of subcutaneous tissue was excised and sent to pathology for   evaluation. The defect was closed with two 3-0 vertical mattress   sutures of 3-0 nylon. We then began the sleeve gastrectomy   portion of the procedure. I made a transverse incision to the   left and superior to the umbilicus and through this incision,   which I made with a separate 15 scalpel blade, I placed an   Optiview trocar. I went through all the appropriate layers of   the anterior abdominal wall until  the peritoneal cavity was   entered. Peritoneal cavity was insufflated to 15 mmHg using   carbon dioxide gas after which a 10 mm 30 degree scope was   inserted. I placed a 5 mm and 15 mm trocars to the right side of   the midline in the right upper quadrant area. A 5 mm trocar was   placed in the epigastric region, was removed and a Nathanson   liver retractor was placed and used to retract the left lobe of   liver throughout the remainder of the procedure. The Satira Mccallumathanson   was attached to an Omni self-retaining retracting device that   was attached to the bed rail on the left hand side of the   patient. A 5 mm trocar was placed in left upper quadrant. I   marked off an area 6 cm proximal to the pylorus and made an    aperture in greater curvature with the EnSeal Trio device. We   then took down the entire greater curvature from 6 cm proximal   to the pylorus all the way up to the angle of His and the left   crus of the diaphragm. Once this had been done, an orogastric   tube that had been placed by the nurse anesthetist was removed   and a calibration tube, 38-French in diameter, was then placed   along the lesser curvature. We placed the stapling device   against this and created a sleeve out of the lesser curvature of   the stomach. The first staple cartridge was black, then green   and then 3 blue staple cartridges were used. The greater   curvature portion of the stomach was removed with all   attachments. The calibration tube was slowly withdrawn. We   checked for evidence of bleeding along the staple line. Some   small bleeding points were cauterized with electrocautery and   the hook. Once there was no further bleeding noted, and the   calibration tube was removed from the body, I placed a suture of   0 Ethibond on the stomach to be excised and this marked it and   helped allow the removal of this segment of stomach. It was   placed into a 15-French Endopouch, which was withdrawn through   the 15 mm trocar site without difficulty. The segment of stomach   was removed and sent to pathology for evaluation. We placed a 15   mm trocar in its site and removed the [facility] liver retractor,   we removed all the remaining trocars without evidence of   bleeding from the trocar sites. The Optiview trocar was removed.   I injected 30 mL of 0.5% Marcaine plain in divided doses to the   5 incision sites. I closed the incision sites with subcuticular   sutures of 4-0 Monocryl followed by Dermabond and Steri-Strips.   The patient tolerated the procedure well, was extubated, brought   to recovery room in stable condition.        Mcadoo Muzquiz Deberah CastleGORDON Sheray Grist, MD      DGA / CLM   D:  02/22/2016   12:37   T:  02/22/2016   14:35   Job #:  [id number]

## 2016-02-23 ENCOUNTER — Inpatient Hospital Stay: Admit: 2016-02-23 | Payer: PRIVATE HEALTH INSURANCE | Primary: Surgery

## 2016-02-23 ENCOUNTER — Inpatient Hospital Stay

## 2016-02-23 LAB — METABOLIC PANEL, BASIC
Anion gap: 9 mmol/L (ref 7–16)
BUN: 11 MG/DL (ref 6–23)
CO2: 27 mmol/L (ref 21–32)
Calcium: 8.4 MG/DL (ref 8.3–10.4)
Chloride: 100 mmol/L (ref 98–107)
Creatinine: 0.92 MG/DL (ref 0.6–1.0)
GFR est AA: >60 mL/min/{1.73_m2} (ref >60)
GFR est non-AA: >60 mL/min/{1.73_m2} (ref >60)
Glucose: 96 mg/dL (ref 65–100)
Potassium: 3.9 mmol/L (ref 3.5–5.1)
Sodium: 136 mmol/L (ref 136–145)

## 2016-02-23 LAB — CBC W/O DIFF
HCT: 39 % (ref 35.8–46.3)
HGB: 13.2 g/dL (ref 11.7–15.4)
MCH: 30.6 PG (ref 26.1–32.9)
MCHC: 33.8 g/dL (ref 31.4–35.0)
MCV: 90.3 FL (ref 79.6–97.8)
MPV: 10.4 FL — ABNORMAL LOW (ref 10.8–14.1)
PLATELET: 304 10*3/uL (ref 150–450)
RBC: 4.32 M/uL (ref 4.05–5.25)
RDW: 13.9 % (ref 11.9–14.6)
WBC: 9.6 10*3/uL (ref 4.3–11.1)

## 2016-02-23 MED ORDER — LEVOTHYROXINE 50 MCG TAB
50 mcg | Freq: Every day | ORAL | Status: DC
Start: 2016-02-23 — End: 2016-02-24
  Administered 2016-02-24: 12:00:00 via ORAL

## 2016-02-23 MED ORDER — DIATRIZOATE MEGLUMINE & SODIUM 66 %-10 % ORAL SOLN
66-10 % | Freq: Once | ORAL | Status: AC
Start: 2016-02-23 — End: 2016-02-23
  Administered 2016-02-23: 14:00:00 via ORAL

## 2016-02-23 MED ORDER — OXYCODONE-ACETAMINOPHEN 5 MG-325 MG TAB
5-325 mg | ORAL | Status: DC | PRN
Start: 2016-02-23 — End: 2016-02-24
  Administered 2016-02-23 – 2016-02-24 (×3): via ORAL

## 2016-02-23 MED ORDER — PANTOPRAZOLE 40 MG TAB, DELAYED RELEASE
40 mg | Freq: Every day | ORAL | Status: DC
Start: 2016-02-23 — End: 2016-02-24
  Administered 2016-02-24: 12:00:00 via ORAL

## 2016-02-23 MED ORDER — ZOLPIDEM 5 MG TAB
5 mg | Freq: Every evening | ORAL | Status: DC | PRN
Start: 2016-02-23 — End: 2016-02-24
  Administered 2016-02-24: 01:00:00 via ORAL

## 2016-02-23 MED FILL — LOVENOX 40 MG/0.4 ML SUBCUTANEOUS SYRINGE: 40 mg/0.4 mL | SUBCUTANEOUS | Qty: 0.4

## 2016-02-23 MED FILL — OXYCODONE-ACETAMINOPHEN 5 MG-325 MG TAB: 5-325 mg | ORAL | Qty: 2

## 2016-02-23 MED FILL — HYDROMORPHONE (PF) 1 MG/ML IJ SOLN: 1 mg/mL | INTRAMUSCULAR | Qty: 1

## 2016-02-23 MED FILL — CLINDAMYCIN 600 MG/4 ML IV: 600 mg/4 mL | INTRAVENOUS | Qty: 4

## 2016-02-23 MED FILL — OFIRMEV 1,000 MG/100 ML (10 MG/ML) INTRAVENOUS SOLUTION: 1000 mg/100 mL (10 mg/mL) | INTRAVENOUS | Qty: 100

## 2016-02-23 MED FILL — ONDANSETRON (PF) 4 MG/2 ML INJECTION: 4 mg/2 mL | INTRAMUSCULAR | Qty: 2

## 2016-02-23 MED FILL — NS WITH POTASSIUM CHLORIDE 20 MEQ/L IV: 20 mEq/L | INTRAVENOUS | Qty: 1000

## 2016-02-23 MED FILL — VENLAFAXINE SR 37.5 MG 24 HR CAP: 37.5 mg | ORAL | Qty: 2

## 2016-02-23 NOTE — Progress Notes (Signed)
Surgery Addendum  No leak or obstruction seen on swallow study. See orders.  Swade Shonka, MD.

## 2016-02-23 NOTE — Progress Notes (Signed)
Nutrition Assessment:  Anthropometrics: Ht: 5'5", Wt: 309#, 247 %IBW, 51.5 BMI  Co-morbidities: OSA, HTN, hypothyroidism  Nutrition Diagnosis:   Altered GI function R/T wt loss surgery as evidenced by s/p VSG.  Morbid obesity as evidenced by 247 %IBW and 51.5 BMI.   Intervention:   1. Pt visited by bariatric RD. Pt awaiting diet advancement and acknowledges the need for OOB movement.     2. RD answered pt questions and emphasized 1. Hydration 2. Protein 3. Movement.   Monitoring and Evaluation:  1. Follow for tolerance of small amounts of non-carbonated, no concentrated sweet clear liquids while admitted.   2. Follow for weight loss per bariatric guidelines.   3. Bariatric RD to f/u as outpatient.     Valarie ConesKatherine Horner, RD, LD

## 2016-02-23 NOTE — Progress Notes (Signed)
Pt c/o 8/10 pain in abdomen administered percocet PO per MD order, will continue to monitor.

## 2016-02-23 NOTE — Progress Notes (Signed)
PM assessment complete. Alert, oriented x 4. IVF infusing without difficulty. Respirations even and unlabored, no distress noted. Abdomen soft, bowel sounds hypoactive in all 4 quadrants. Six abdominal PS intact. SCDS in place. Aware to call should needs arise.

## 2016-02-23 NOTE — Progress Notes (Signed)
General Surgery Progress Note    02/23/2016    Admit Date: 02/22/2016    Subjective:     Surgery POD #1  No major problems overnight. Patient reports incisional pain. No nausea or vomiting. Very "excited" about her new journey.    Objective:     Visit Vitals   ??? BP 107/47 (BP 1 Location: Right arm, BP Patient Position: At rest;Head of bed elevated (Comment degrees))   ??? Pulse 75   ??? Temp 97.9 ??F (36.6 ??C)   ??? Resp 16   ??? Wt 303 lb 2 oz (137.5 kg)   ??? SpO2 93%   ??? BMI 50.44 kg/m2         Intake/Output Summary (Last 24 hours) at 02/23/16 0745  Last data filed at 02/23/16 0408   Gross per 24 hour   Intake             3453 ml   Output             1020 ml   Net             2433 ml        EXAM:  ABD soft, mild incisional tenderness, active BS'S. Wounds are intact without signs of bleeding or infection.        Data Review    Recent Results (from the past 24 hour(s))   METABOLIC PANEL, BASIC    Collection Time: 02/23/16  6:02 AM   Result Value Ref Range    Sodium 136 136 - 145 mmol/L    Potassium 3.9 3.5 - 5.1 mmol/L    Chloride 100 98 - 107 mmol/L    CO2 27 21 - 32 mmol/L    Anion gap 9 7 - 16 mmol/L    Glucose 96 65 - 100 mg/dL    BUN 11 6 - 23 MG/DL    Creatinine 2.130.92 0.6 - 1.0 MG/DL    GFR est AA >08>60 >65>60 HQ/ION/6.29B2ml/min/1.73m2    GFR est non-AA >60 >60 ml/min/1.8373m2    Calcium 8.4 8.3 - 10.4 MG/DL   CBC W/O DIFF    Collection Time: 02/23/16  6:02 AM   Result Value Ref Range    WBC 9.6 4.3 - 11.1 K/uL    RBC 4.32 4.05 - 5.25 M/uL    HGB 13.2 11.7 - 15.4 g/dL    HCT 84.139.0 32.435.8 - 40.146.3 %    MCV 90.3 79.6 - 97.8 FL    MCH 30.6 26.1 - 32.9 PG    MCHC 33.8 31.4 - 35.0 g/dL    RDW 02.713.9 25.311.9 - 66.414.6 %    PLATELET 304 150 - 450 K/uL    MPV 10.4 (L) 10.8 - 14.1 FL        Hospital Problems  Never Reviewed          Codes Class Noted POA    * (Principal)Morbid obesity with BMI of 50.0-59.9, adult (HCC) ICD-10-CM: E66.01, Z68.43  ICD-9-CM: 278.01, V85.43  02/22/2016 Unknown            1. Swallow study today.  2. Pain control.  3. IVF'S.   4. Anti-emetics as necessary  5. OOB/IS/Lovenox  6. Further orders after swallow study.    Dareen PianoAnderson, MD.

## 2016-02-23 NOTE — Progress Notes (Signed)
Patient ambulating in hallway. Tolerating well.

## 2016-02-23 NOTE — Progress Notes (Signed)
Pt c/o 5/10 pain administered dilaudid 0.5 mg PO per MD order, will continue to monitor.

## 2016-02-23 NOTE — Progress Notes (Signed)
Pt assessment completed. Alert and oriented. Lungs CTA even and unlabored. Heart sounds regular. Abdomen soft and tender with active bowel sounds. Iv present and infusing without difficulty. Pt c/o pain, pain medication given. 6 PS noted to abdomen with steri strips, clean dry and intact. All needs met at this time, will continue to monitor.

## 2016-02-23 NOTE — Progress Notes (Signed)
Ambien effective, patient resting in bed with eyes closed.

## 2016-02-23 NOTE — Progress Notes (Signed)
Ambien 4 mg given per patients request for sleep. Aware to call should needs arise.

## 2016-02-23 NOTE — Progress Notes (Signed)
Assessment complete. Alert, oriented x 4. Respirations even and unlabored, no distress noted. IVF infusing without difficulty. Abdomen soft, bowel sounds hypoactive in all 4 quadrants. Six abdominal PS with steristrips clean, dry and intact. Denis passing flatus. SCDS in place. Denies needs.

## 2016-02-24 LAB — CBC W/O DIFF
HCT: 35.6 % — ABNORMAL LOW (ref 35.8–46.3)
HGB: 11.7 g/dL (ref 11.7–15.4)
MCH: 30 PG (ref 26.1–32.9)
MCHC: 32.9 g/dL (ref 31.4–35.0)
MCV: 91.3 FL (ref 79.6–97.8)
MPV: 10.1 FL — ABNORMAL LOW (ref 10.8–14.1)
PLATELET: 280 10*3/uL (ref 150–450)
RBC: 3.9 M/uL — ABNORMAL LOW (ref 4.05–5.25)
RDW: 14.7 % — ABNORMAL HIGH (ref 11.9–14.6)
WBC: 8 10*3/uL (ref 4.3–11.1)

## 2016-02-24 LAB — METABOLIC PANEL, BASIC
Anion gap: 10 mmol/L (ref 7–16)
BUN: 11 MG/DL (ref 6–23)
CO2: 26 mmol/L (ref 21–32)
Calcium: 8 MG/DL — ABNORMAL LOW (ref 8.3–10.4)
Chloride: 104 mmol/L (ref 98–107)
Creatinine: 0.89 MG/DL (ref 0.6–1.0)
GFR est AA: >60 mL/min/{1.73_m2} (ref >60)
GFR est non-AA: >60 mL/min/{1.73_m2} (ref >60)
Glucose: 79 mg/dL (ref 65–100)
Potassium: 3.6 mmol/L (ref 3.5–5.1)
Sodium: 140 mmol/L (ref 136–145)

## 2016-02-24 MED FILL — LOVENOX 40 MG/0.4 ML SUBCUTANEOUS SYRINGE: 40 mg/0.4 mL | SUBCUTANEOUS | Qty: 0.4

## 2016-02-24 MED FILL — NS WITH POTASSIUM CHLORIDE 20 MEQ/L IV: 20 mEq/L | INTRAVENOUS | Qty: 1000

## 2016-02-24 MED FILL — PANTOPRAZOLE 40 MG TAB, DELAYED RELEASE: 40 mg | ORAL | Qty: 1

## 2016-02-24 MED FILL — VENLAFAXINE SR 37.5 MG 24 HR CAP: 37.5 mg | ORAL | Qty: 2

## 2016-02-24 MED FILL — OXYCODONE-ACETAMINOPHEN 5 MG-325 MG TAB: 5-325 mg | ORAL | Qty: 2

## 2016-02-24 MED FILL — ZOLPIDEM 5 MG TAB: 5 mg | ORAL | Qty: 1

## 2016-02-24 MED FILL — LEVOTHROID 50 MCG TABLET: 50 mcg | ORAL | Qty: 1

## 2016-02-24 NOTE — Progress Notes (Signed)
General Surgery Progress Note    02/24/2016    Admit Date: 02/22/2016    Subjective:     Surgery POD #2  Patient has done well. Patient has voided without the Foley, ambulated, tolerated liquids and pain is controlled with just oral analgesics.     Objective:     Visit Vitals   ??? BP 104/60 (BP 1 Location: Left arm, BP Patient Position: At rest;Head of bed elevated (Comment degrees))   ??? Pulse 68   ??? Temp 96.2 ??F (35.7 ??C)   ??? Resp 16   ??? Wt 303 lb 2 oz (137.5 kg)   ??? SpO2 97%   ??? BMI 50.44 kg/m2         Intake/Output Summary (Last 24 hours) at 02/24/16 0714  Last data filed at 02/24/16 0516   Gross per 24 hour   Intake             1759 ml   Output              750 ml   Net             1009 ml        EXAM:  ABD soft, mild incisional tenderness, active BS'S. Wounds are intact without signs of infection or evidence of bleeding.        Data Review  No results found for this or any previous visit (from the past 24 hour(s)).     Hospital Problems  Never Reviewed          Codes Class Noted POA    * (Principal)Morbid obesity with BMI of 50.0-59.9, adult (HCC) ICD-10-CM: E66.01, Z68.43  ICD-9-CM: 278.01, V85.43  02/22/2016 Unknown            1. Discharge to home.   2. F/U with me on 03/03/16.    Dareen PianoAnderson, MD.

## 2016-02-24 NOTE — Discharge Summary (Signed)
BARIATRIC DISCHARGE SUMMARY      Patient: Krystal Acevedo MRN: [id number]  SSN: [id number]    Date of Birth: 1970/12/28  Age: 45 y.o.  Sex: female      ADMIT DATE: 02/22/2016    ADMITTING DIAGNOSIS: Morbid obesity, unspecified obesity type [E66.01]     DISCHARGE DIAGNOSIS: Morbid obesity.     PROCEDURES: Procedure(s):  LAPAROSCOPIC GASTRECTOMY SLEEVE; EXCISION OF PERIUMBILICUS NEVUS    HISTORY: The patient is a 45 y.o. female who came to the [facility] for consideration of weight loss surgery. The patient completed all of the requirements, meeting with the dietitian, a psychologist, and with myself. The patient had a good understanding of the risks and benefits of the procedure. After a discussion and signing our extensive consent form that we have in our office, the patient wanted to proceed with surgery and was scheduled for a sleeve gastrectomy. The patient underwent an  uneventful laparoscopic sleeve gastrectomy. The patient was then brought to the recovery room and subsequently to the third floor. The patient was up and ambulating, using her incentive spirometer the night of surgery. The morning after surgery, she had a negative Gastrografin swallow that was negative for leak or obstruction. The patient was started on a liquid diet. Foley catheter was removed and she was allowed to shower. The patient is ready for discharge on postop day #2, having met my discharge requirements. The patient has been able to tolerate a diet, voided without the Foley catheter, passed flatus, ambulated in the hallway, and her pain is controlled with just oral analgesics. The patient is ready for discharge in my opinion.     DISCHARGE CONDITION: Stable.     DISPOSITION: To go home.     DISCHARGE MEDICATIONS:  See Medication Reconciliation   Percocet and Protonix prescriptions written.    DIET: A liquid diet with 2 protein shakes a day to try to get 60 grams of  protein. The patient has been told not to eat any solid food, nothing that she has to chew. The patient's medications were reconciled in the chart. The patient was given a prescription for pain medication and every patient takes a proton pump inhibitor for 3 months after surgery. The patient has also been told she can walk, climb stairs, shower. No tub baths, swimming, heavy lifting. Nothing where she is going to stress or strain.     FOLLOW UP: With me on 03/03/16.      Signed by:  Fredna Dow, MD                     February 24, 2016

## 2016-02-24 NOTE — Progress Notes (Signed)
Discharge instructions and prescriptions provided and explained to the pt. Med side effect sheet reviewed.  Opportunity for questions provided. Pt is waiting on ride.  Instructed to call once ready to leave.

## 2016-02-24 NOTE — Progress Notes (Signed)
Pt assessment completed. Alert and oriented. Lungs CTA diminished in bases. Heart sounds regular. Abdomen soft and tender with active bowel sounds. Iv present and infusing without difficulty. Pt denies pain needs at this time. 6 PS noted to abdomen with steri strips, clean dry and intact. All needs met at this time, will continue to monitor.

## 2016-02-24 NOTE — Discharge Summary (Signed)
BARIATRIC DISCHARGE SUMMARY      Patient: Krystal Acevedo MRN: [id number]  SSN: [id number]    Date of Birth: March 03, 1971  Age: 44 y.o.  Sex: female      ADMIT DATE: 02/22/2016    ADMITTING DIAGNOSIS: Morbid obesity, unspecified obesity type [E66.01]     DISCHARGE DIAGNOSIS: Morbid obesity.     PROCEDURES: Procedure(s):  LAPAROSCOPIC GASTRECTOMY SLEEVE; EXCISION OF PERIUMBILICUS NEVUS    HISTORY: The patient is a 45 y.o. female who came to the [facility] for consideration of weight loss surgery. The patient completed all of the requirements, meeting with the dietitian, a psychologist, and with myself. The patient had a good understanding of the risks and benefits of the procedure. After a discussion and signing our extensive consent form that we have in our office, the patient wanted to proceed with surgery and was scheduled for a sleeve gastrectomy. The patient underwent an  uneventful laparoscopic sleeve gastrectomy. The patient was then brought to the recovery room and subsequently to the third floor. The patient was up and ambulating, using her incentive spirometer the night of surgery. The morning after surgery, she had a negative Gastrografin swallow that was negative for leak or obstruction. The patient was started on a liquid diet. Foley catheter was removed and she was allowed to shower. The patient is ready for discharge on postop day #2, having met my discharge requirements. The patient has been able to tolerate a diet, voided without the Foley catheter, passed flatus, ambulated in the hallway, and her pain is controlled with just oral analgesics. The patient is ready for discharge in my opinion.     DISCHARGE CONDITION: Stable.     DISPOSITION: To go home.     DISCHARGE MEDICATIONS:  See Medication Reconciliation   Percocet and Protonix prescriptions written.    DIET: A liquid diet with 2 protein shakes a day to try to get 60 grams of protein. The patient has been told not to eat  any solid food, nothing that she has to chew. The patient's medications were reconciled in the chart. The patient was given a prescription for pain medication and every patient takes a proton pump inhibitor for 3 months after surgery. The patient has also been told she can walk, climb stairs, shower. No tub baths, swimming, heavy lifting. Nothing where she is going to stress or strain.     FOLLOW UP: With me on 03/03/16.      Signed by:  Fredna Dow, MD                     February 24, 2016

## 2016-02-25 NOTE — Telephone Encounter (Signed)
Bariatric Surgery Phone Call Follow Up       Patient: Krystal Acevedo MRN: [id number]  SSN: [id number]    Date of Birth: Feb 06, 1971  Age: 45 y.o.  Sex: female          Date:  02/25/2016    History:  The patient had sleeve gastrectomy surgery with Dr. Dareen PianoAnderson on 02-22-16.  She was discharged home 02-24-16.    Assessment and Physical Exam:    The patient states she is doing well.  She is having to remind herself to eat and drink, but feels very good.  She is staying at hotel and is walking and feels really good.  She states she is not having any difficulty with protein first full liquid diet.  Denies abdominal pain, vomiting, or nausea.    No fever or chills.   Incisions: Mild discomfort, no signs of infection. Some itching.  Denies reflux. Denies dysphagia.  No bowel movement yet, passing flatus, may take laxative if she needs it.       Education Reminders:  The patient was reminded to call the office at [phone] for questions or problems.  Push fluids with goal of 64 ounces per day.  Protein intake should be a minimum  60 grams/day.  Stay active with frequent ambulation, but no strenuous exercise.  Continue full liquid protein first diet.         Patient Satisfaction:  The patient states that her hospital stay was perfect and everyone was very nice and helpful and her experience could not have been better.  Stated opportunities for improvement were :  None        02/25/2016  5:31 PM     Daisy FloroKathy E Niaya Hickok, RN

## 2016-02-29 NOTE — Telephone Encounter (Signed)
Patient called and states having an outbreak on her face of fever blisters and would like to take her Acyclovir.  Advised that this is ok to take and she will break pill in half as it it larger pill.  Otherwise the patient states she feels great and is having no problems from surgery and she is tolerating a full liquid diet without difficulty.

## 2016-03-01 ENCOUNTER — Encounter: Attending: Surgery | Primary: Surgery

## 2016-03-02 NOTE — Telephone Encounter (Signed)
Patient contacted via televox on 03/01/16 to confirm an appointment scheduled for 03/03/16. Patient answered and confirmed.

## 2016-03-02 NOTE — Telephone Encounter (Signed)
Pt reports some itching comes & goes around where steri strips were. She said it isn't red or doesn't have a rash. She has appt. Tomorrow with us. I told her to try some corti zone on the areas. If that doesn't work call me back & will send something to her pharmacy. I also offered she could come by office. She doesn't think it's that bad.

## 2016-03-03 ENCOUNTER — Encounter: Attending: Surgery | Primary: Surgery

## 2016-03-03 ENCOUNTER — Ambulatory Visit: Admit: 2016-03-03 | Discharge: 2016-03-03 | Attending: Surgery | Primary: Surgery

## 2016-03-03 MED ORDER — CLOBETASOL 0.05 % TOPICAL CREAM
0.05 % | Freq: Two times a day (BID) | CUTANEOUS | 3 refills | Status: DC
Start: 2016-03-03 — End: 2016-04-05

## 2016-03-03 MED ORDER — METHYLPREDNISOLONE 4 MG TABS IN A DOSE PACK
4 mg | ORAL | 0 refills | Status: DC
Start: 2016-03-03 — End: 2016-04-05

## 2016-03-03 NOTE — Progress Notes (Signed)
Bariatric Surgery Follow Up Visit    Patient: Krystal Acevedo MRN: [id number]  SSN: [id number]    Date of Birth: 06-02-1971  Age: 45 y.o.  Sex: female              PCP: Blake Divineavid G Lathaniel Legate, MD   Date:  03/03/2016    Weight History Graph    [facility] Review 03/03/2016   Surgeon: Ulyess Muto   Date of Surgery: 02/22/2016   Procedure: Sleeve Gastrectomy   Pre-op Weight 320   Ideal Body Weight 155   Excess Body Weight 165     [facility] EBWL Review 03/03/2016 03/03/2016 02/22/2016 02/22/2016 02/12/2016 02/12/2016   Pre-op Weight 320 - - - - -   Weight 298 lb - 303 lb 2 oz - 309 lb 3.2 oz -   BMI - 49.59 - 50.44 - 51.45   Weight Comments -- -- - - -- --   Total Weight Lost 22 - - - - -   Total % EBWL 13.33 - - - - -       1 1/2 week post-op visit after a lap sleeve gastrectomy was done on 04-23-16 .     Evaluation of Pre Operative Co Morbid Conditions:    Sleep Apnea-yes uses CPAP-yes    Hypertension-resolved    Clinical Assessment and Physical Exam:    The patient states she is doing very well.  She is pleased with her weight loss so far.  Her only problem has been itchy inciisons.  She is having an allergic response at her incision sites.  She also has some consitpation and we have discussed prevention and relief measures.  Protein:  60 gms/day  Fluids:    40-60 ounces/day  Exercise:  Walking daily 20-30 min/day  Denies abdominal pain, vomiting, or nausea.  No fever or chills.   Incisions :  Allergic response  Denies reflux. Denies dysphagia.  Tolerating Protein first diet without difficulty.    Physical Exam:  Visit Vitals   ??? BP 126/81   ??? Pulse 76   ??? Ht 5\' 5"  (1.651 m)   ??? Wt 298 lb (135.2 kg)   ??? BMI 49.59 kg/m2       General: Alert, oriented, cooperative white female in no acute distress.   Eyes: Sclera are clear. Conjunctiva and lids within normal limits. No icterus.  Ears and Nose: no gross deformities to visual inspection, gross hearing intact   Neck: Supple, trachea midline, no appreciable thyromegaly  Resp:  Breathing is  non-labored. Lungs clear to auscultation without wheezing or rhonchi   CV: RRR. No murmurs, rubs or gallops appreciated.  Abd: soft, not distended, active BS'S. No tenderness to palpation. Wounds are clean and intact with maculopapular rash , no signs of infection.    Discussed the patient's BMI with her.  The BMI follow up plan is as follows:  Plan:  Progress diet to soft protein first diet  Go over diet with Katie Horner  Exercise:   Keep up current routine, continue light activites  Continue appropriate supplements  Follow up 3 weeks  Clobetasol cream and Medrol dose pack sent to pharmacy  Remove umbilical sutures           Blake Divineavid G Renea Schoonmaker, MD     03/03/2016  1:51 PM

## 2016-03-03 NOTE — Progress Notes (Signed)
NUTRITION REASSESSMENT  Pt doing well with full liquid diet compliance.  Intake of ~60g protein/d and ~40-60oz fluid/d. Pt is walking daily for ~20-30 minutes.  MD has progressed pt to pureed/smooth diet. Pt is taking a recommended MVI.        Intervention:   1. Discussed food choices and meal ideas on pureed/smooth diet.  Suggested pt use protein supplements throughout the day as snacks rather than as meal replacement.    2. Encouraged continued and increased activity.      Monitoring and Evaluation:  1. Monitor for continued safe, supervised weight loss for VSG.     2. Monitor pt for meeting protein requirements of 60-80g/d.    3. Follow for tolerance of pureed/smooth diet.   4. F/U per MD     Valarie ConesKatherine Horner, MS, RD, LD, NASM-CPT, CES

## 2016-03-24 ENCOUNTER — Encounter: Attending: Surgery | Primary: Surgery

## 2016-03-24 NOTE — Telephone Encounter (Signed)
Patient called and states having trouble with constipation and saw her  PCP this am.  She does have our constipation instruction sheet and has not tried Mag Citrate yet.  She was instructed to use Mag Citrate and then start on prevention as described in instruction sheet .  She was also advised that she can take medication Amitiza or Dexilant as directed by her PCP as constipation was also a chronic issue for her prior to surgery.  She is to call back if no improvement in symptoms.

## 2016-04-04 NOTE — Telephone Encounter (Signed)
Patient contacted via televox on 04/01/16 to confirm an appointment for 04/05/16. LVM

## 2016-04-05 ENCOUNTER — Ambulatory Visit: Admit: 2016-04-05 | Discharge: 2016-04-05 | Attending: Surgery | Primary: Surgery

## 2016-04-05 NOTE — Progress Notes (Signed)
NUTRITION REASSESSMENT  Pt getting ~60-80g protein/d and ~60-80oz fluid/d. Pt is eating at least 3x/d. She is drinking water and flavored water. Pt is walking and doing strength training exercises, 4-6x/wk for ~30-60 minutes.  Pt is taking a recommended MVI.  Diet Recall:   Breakfast: egg   Lunch: tuna salad   Dinner: chicken, green beans   Snacks: banana, protein shake       Intervention:   1. Evaluated diet recall and encouraged continued dietary and exercise compliance and variety.        Monitoring and Evaluation:  1. Monitor for continued safe, supervised weight loss for VSG.   2. Follow for continued compliance.    3. F/U per MD     Valarie Cones, MS, RD, LD, NASM-CPT, CES

## 2016-04-05 NOTE — Progress Notes (Signed)
Bariatric Surgery Follow Up Visit    Patient: Krystal Acevedo MRN: [id number]  SSN: [id number]    Date of Birth: 1970/10/14  Age: 45 y.o.  Sex: female              PCP: Blake Divine, MD   Date:  04/05/2016    Weight History Graph    [employer] [facility] Intake Questions Review 04/05/2016 03/03/2016   Surgeon: Esaw Dace   Date of Surgery: 02/22/2016 02/22/2016   Procedure: Sleeve Gastrectomy Sleeve Gastrectomy   Pre-op Weight 320 320   Ideal Body Weight 155 155   Excess Body Weight 165 165     [facility] EB[facility] Review 04/05/2016 04/05/2016 03/03/2016 03/03/2016 02/22/2016 02/22/2016   Pre-op Weight 320 - 320 - - -   Weight 276 lb - 298 lb - 303 lb 2 oz -   BMI - 45.93 - 49.59 - 50.44   Weight Comments -- -- -- -- - -   Total Weight Lost 44 - 22 - - -   Total % EBWL 26.67 - 13.33 - - -       6 week post-op visit after a lap sleeve gastrectomy was done on 02-22-16 .   She has lost 22 lbs since her last office visit.    Evaluation of Pre Operative Co Morbid Conditions:    Sleep Apnea-resolved    Hypertension-resolved    Clinical Assessment and Physical Exam:    The patient states she is doing great.  Her constipation completely resolved with addition of Linzess by her PCP.  Her incisions have cleared up and she feels "wonderful".  Protein:  60-80 gms/day  Fluids:    60-80 ounces/day  Exercise:  Cardio and strength training 4-6x/week  Denies abdominal pain, vomiting, or nausea.  No fever or chills.   Incisions healing well.  Denies reflux. Denies dysphagia.  Regular bowel movements.   Tolerating Protein first diet without difficulty.    Physical Exam:  Visit Vitals   ??? BP 120/75   ??? Pulse 63   ??? Ht 5\' 5"  (1.651 m)   ??? Wt 276 lb (125.2 kg)   ??? BMI 45.93 kg/m2       General: Alert, oriented, cooperative white female in no acute distress.   Eyes: Sclera are clear. Conjunctiva and lids within normal limits. No icterus.  Ears and Nose: no gross deformities to visual inspection, gross hearing intact   Neck: Supple, trachea midline, no appreciable thyromegaly  Resp:  Breathing is  non-labored. Lungs clear to auscultation without wheezing or rhonchi   CV: RRR. No murmurs, rubs or gallops appreciated.  Abd: soft, not distended, active BS'S. No tenderness to palpation. Wounds are clean, dry and intact without signs of infection.     Discussed the patient's BMI with her.  The BMI follow up plan is as follows:  Plan:  Protein first diet  Go over diet with Katie Horner  Exercise:   Keep up current routine  Continue appropriate supplements  Follow up 6 weeks          Blake Divine, MD     04/05/2016  1:41 PM

## 2016-05-16 NOTE — Telephone Encounter (Signed)
Patient was contacted via Televox on 05/13/16 for confirmation of appointment. No answer. Left message.

## 2016-05-17 ENCOUNTER — Encounter: Attending: Surgery | Primary: Surgery

## 2016-05-30 ENCOUNTER — Encounter: Attending: Surgery | Primary: Surgery

## 2017-01-04 NOTE — Progress Notes (Signed)
Patient has missed follow up with [facility]. Phone call to patient 01/04/17 (lvm) and letter requesting follow-up was mailed 01/04/17.

## 2017-01-13 NOTE — Progress Notes (Signed)
Letter requesting follow that was mailed on 01/04/17 was returned undeliverable 01/13/17.

## 2017-06-26 MED ORDER — PANTOPRAZOLE 40 MG TAB, DELAYED RELEASE
40 mg | ORAL_TABLET | ORAL | 4 refills | Status: DC
Start: 2017-06-26 — End: 2018-06-30

## 2017-09-19 NOTE — Progress Notes (Signed)
Patient has missed follow up with [facility]. Phone call to patient 09/19/17 (n/a) and letter requesting follow-up was mailed 09/19/17.

## 2018-02-13 ENCOUNTER — Encounter (HOSPITAL_BASED_OUTPATIENT_CLINIC_OR_DEPARTMENT_OTHER): Payer: Self-pay | Admitting: Emergency Medicine

## 2018-02-13 ENCOUNTER — Other Ambulatory Visit: Payer: Self-pay

## 2018-02-13 ENCOUNTER — Emergency Department (HOSPITAL_BASED_OUTPATIENT_CLINIC_OR_DEPARTMENT_OTHER)
Admission: EM | Admit: 2018-02-13 | Discharge: 2018-02-13 | Disposition: A | Discharge status: Home / Self Care | Payer: 59 | Attending: Emergency Medicine | Admitting: Emergency Medicine

## 2018-02-13 DIAGNOSIS — L0291 Cutaneous abscess, unspecified: Secondary | ICD-10-CM

## 2018-02-13 DIAGNOSIS — L0201 Cutaneous abscess of face: Secondary | ICD-10-CM | POA: Diagnosis not present

## 2018-02-13 DIAGNOSIS — Z79899 Other long term (current) drug therapy: Secondary | ICD-10-CM | POA: Insufficient documentation

## 2018-02-13 DIAGNOSIS — E079 Disorder of thyroid, unspecified: Secondary | ICD-10-CM | POA: Diagnosis not present

## 2018-02-13 DIAGNOSIS — I10 Essential (primary) hypertension: Secondary | ICD-10-CM | POA: Insufficient documentation

## 2018-02-13 HISTORY — DX: Essential (primary) hypertension: I10

## 2018-02-13 HISTORY — DX: Major depressive disorder, single episode, unspecified: F32.9

## 2018-02-13 HISTORY — DX: Disorder of thyroid, unspecified: E07.9

## 2018-02-13 HISTORY — DX: Depression, unspecified: F32.A

## 2018-02-13 MED ORDER — LIDOCAINE-EPINEPHRINE (PF) 2 %-1:200000 IJ SOLN
10.0000 mL | Freq: Once | INTRAMUSCULAR | Status: AC
  Administered 2018-02-13: 10 mL via INTRADERMAL
  Filled 2018-02-13 (×2): qty 10

## 2018-02-13 MED ORDER — DOXYCYCLINE HYCLATE 100 MG PO CAPS: 100.0000 mg | ORAL_CAPSULE | Freq: Two times a day (BID) | ORAL | 0 refills | Status: AC

## 2018-02-13 MED FILL — DOXYCYCLINE HYCLATE 100 MG: 100 | 5 days supply | Qty: 10 | Fill #0

## 2018-02-13 NOTE — Discharge Instructions (Signed)
Seen today for an abscess on your head.  We have made an incision and drained some of the pus out.  We have also given you a new prescription antibiotic.  Please stop taking the clindamycin and take the doxycycline 1 pill twice a day for the next 5 days.  Can take Tylenol or ibuprofen as needed for any pain or discomfort.  Please go back to the doctor if your abscess looks more red, becomes more painful, you develop a fever, chills, nausea or vomiting.

## 2018-02-13 NOTE — ED Provider Notes (Signed)
[facility] EMERGENCY DEPARTMENT Provider Note   CSN: [id number] Arrival date & time: 02/13/18  0755     History   Chief Complaint Chief Complaint  Patient presents with  . Abscess    HPI Ronelle NighDana Creque is a 47 y.o. female with no significant PMH here for facial abscess.   Facial Abscess:  Patient notes that 5 days ago she got a "pimple" in the middle of her forehead.  She notes that she was trying to pop it but it became progressively more inflamed, painful, and swollen.  She notes that she went to urgent care yesterday and was diagnosed with an abscess and given a prescription for clindamycin 4 times daily.  She notes that she has taken 5 doses of the clindamycin and she feels like the infection is actually getting worse.  Has been no drainage from the abscess but she has been trying to pop it.  Has been using warm compresses.  She notes that she developed some periorbital edema over the last day.  She denies any fevers, chills, nausea, vomiting.    HPI  Past Medical History:  Diagnosis Date  . Depression   . Hypertension   . Thyroid disease     There are no active problems to display for this patient.   Past Surgical History:  Procedure Laterality Date  . ABDOMINAL HYSTERECTOMY    . LAPAROSCOPIC GASTRIC SLEEVE RESECTION    . SINUS IRRIGATION       OB History   None      Home Medications    Prior to Admission medications   Medication Sig Start Date End Date Taking? Authorizing Provider  cloNIDine (CATAPRES) 0.1 MG tablet Take 0.1 mg by mouth 2 (two) times daily.   Yes [provider]  hydrochlorothiazide (HYDRODIURIL) 25 MG tablet Take 25 mg by mouth daily.   Yes [provider]  lansoprazole (PREVACID) 15 MG capsule Take 15 mg by mouth daily at 12 noon.   Yes [provider]  levothyroxine (SYNTHROID, LEVOTHROID) 100 MCG tablet Take 100 mcg by mouth daily before breakfast.   Yes [provider]  venlafaxine (EFFEXOR)  100 MG tablet Take 100 mg by mouth 2 (two) times daily.   Yes [provider]  doxycycline (VIBRAMYCIN) 100 MG capsule Take 1 capsule (100 mg total) by mouth 2 (two) times daily for 5 days. 02/13/18 02/18/18  Beaulah DinningGambino, Christina M, MD    Family History No family history on file.  Social History Social History   Tobacco Use  . Smoking status: Never Smoker  . Smokeless tobacco: Never Used  Substance Use Topics  . Alcohol use: Yes  . Drug use: Never     Allergies   Sulfa antibiotics   Review of Systems Review of Systems  Constitutional: Negative for fatigue and fever.  HENT: Negative for congestion, ear discharge and sore throat.   Eyes: Negative for discharge.  Respiratory: Negative for shortness of breath.   Cardiovascular: Negative for chest pain.  Skin: Positive for wound (forehead).     Physical Exam Updated Vital Signs BP 125/77 (BP Location: Left Arm)   Pulse 82   Temp 98.5 F (36.9 C) (Oral)   Resp 18   Ht 5\' 5"  (1.651 m)   Wt 106.6 kg (235 lb)   SpO2 98%   BMI 39.11 kg/m   Physical Exam  Constitutional: She is oriented to person, place, and time. She appears well-developed and well-nourished.  HENT:  Head: Normocephalic. Head  is with right periorbital erythema and with left periorbital erythema.  Right Ear: External ear normal.  Left Ear: External ear normal.  Nose: Nose normal.  Mouth/Throat: Oropharynx is clear and moist.  Eyes: Pupils are equal, round, and reactive to light. Conjunctivae are normal.  Neck: Normal range of motion. Neck supple.  Cardiovascular: Normal rate and regular rhythm.  Murmur heard. Pulmonary/Chest: Effort normal and breath sounds normal. No respiratory distress.  Abdominal: Soft. Bowel sounds are normal.  Musculoskeletal: Normal range of motion. She exhibits no edema.  Neurological: She is alert and oriented to person, place, and time. No sensory deficit. She exhibits normal muscle tone.  Skin: Skin is warm and dry.   Forehead: ~2 inch in circumference erythema induration but no appreciable fluctuance Central abrasion on lesion        ED Treatments / Results  Labs (all labs ordered are listed, but only abnormal results are displayed) Labs Reviewed - No data to display  EKG None  Radiology No results found.  Procedures .Marland KitchenIncision and Drainage Date/Time: 02/13/2018 9:29 AM Performed by: Beaulah Dinning, MD Authorized by: Pricilla Loveless, MD   Consent:    Consent obtained:  Verbal   Consent given by:  Patient   Risks discussed:  Bleeding, incomplete drainage, pain and infection   Alternatives discussed:  Observation Location:    Type:  Abscess   Size:  2 cm   Location:  Head Pre-procedure details:    Skin preparation:  Betadine Anesthesia (see MAR for exact dosages):    Anesthesia method:  Local infiltration   Local anesthetic:  Lidocaine 2% WITH epi Procedure type:    Complexity:  Simple Procedure details:    Needle aspiration: no     Incision types:  Stab incision   Incision depth:  Dermal   Drainage:  Purulent and bloody   Drainage amount:  Scant   Wound treatment:  Wound left open   Packing materials:  None Post-procedure details:    Patient tolerance of procedure:  Tolerated well, no immediate complications   (including critical care time)  Medications Ordered in ED Medications  lidocaine-EPINEPHrine (XYLOCAINE W/EPI) 2 %-1:200000 (PF) injection 10 mL (10 mLs Intradermal Given 02/13/18 0930)     Initial Impression / Assessment and Plan / ED Course  I have reviewed the triage vital signs and the nursing notes.  Pertinent labs & imaging results that were available during my care of the patient were reviewed by me and considered in my medical decision making (see chart for details).     47 year old female here for abscess on her forehead.  No systemic signs of infection, afebrile and well-appearing.  Incision and drainage was performed of the abscess on her  forehead with a scant amount of white purulent discharge expressed.  Patient was instructed to stop taking clindamycin instead take doxycycline 100 mg twice daily for 5-day course.  She will take Tylenol or ibuprofen as needed for pain.  Return precautions discussed.  At this time patient is stable for discharge.  Final Clinical Impressions(s) / ED Diagnoses   Final diagnoses:  Abscess    ED Discharge Orders        Ordered    doxycycline (VIBRAMYCIN) 100 MG capsule  2 times daily     02/13/18 0927       Beaulah Dinning, MD 02/13/18 1610    Pricilla Loveless, MD 02/13/18 1044

## 2018-02-13 NOTE — ED Triage Notes (Signed)
Abscess to center of forehead. [facility] started on [facility]ind[facility].

## 2018-04-26 DIAGNOSIS — S92355D Nondisplaced fracture of fifth metatarsal bone, left foot, subsequent encounter for fracture with routine healing: Secondary | ICD-10-CM | POA: Diagnosis not present

## 2018-05-08 DIAGNOSIS — R030 Elevated blood-pressure reading, without diagnosis of hypertension: Secondary | ICD-10-CM | POA: Diagnosis not present

## 2018-05-08 DIAGNOSIS — N951 Menopausal and female climacteric states: Secondary | ICD-10-CM | POA: Diagnosis not present

## 2018-05-08 DIAGNOSIS — Z23 Encounter for immunization: Secondary | ICD-10-CM | POA: Diagnosis not present

## 2018-05-08 DIAGNOSIS — E039 Hypothyroidism, unspecified: Secondary | ICD-10-CM | POA: Diagnosis not present

## 2018-05-09 DIAGNOSIS — S92352A Displaced fracture of fifth metatarsal bone, left foot, initial encounter for closed fracture: Secondary | ICD-10-CM | POA: Diagnosis not present

## 2018-05-09 DIAGNOSIS — S92352D Displaced fracture of fifth metatarsal bone, left foot, subsequent encounter for fracture with routine healing: Secondary | ICD-10-CM | POA: Diagnosis not present

## 2018-05-24 DIAGNOSIS — S92352K Displaced fracture of fifth metatarsal bone, left foot, subsequent encounter for fracture with nonunion: Secondary | ICD-10-CM | POA: Diagnosis not present

## 2018-05-25 ENCOUNTER — Ambulatory Visit: Payer: 59 | Admitting: Internal Medicine

## 2018-06-08 DIAGNOSIS — S92352D Displaced fracture of fifth metatarsal bone, left foot, subsequent encounter for fracture with routine healing: Secondary | ICD-10-CM | POA: Diagnosis not present

## 2018-06-25 ENCOUNTER — Ambulatory Visit (INDEPENDENT_AMBULATORY_CARE_PROVIDER_SITE_OTHER): Payer: 59 | Admitting: Internal Medicine

## 2018-06-25 ENCOUNTER — Encounter: Payer: Self-pay | Admitting: Internal Medicine

## 2018-06-25 VITALS — BP 126/70 | HR 61 | Ht 65.0 in | Wt 240.6 lb

## 2018-06-25 DIAGNOSIS — R002 Palpitations: Secondary | ICD-10-CM | POA: Diagnosis not present

## 2018-06-25 DIAGNOSIS — R011 Cardiac murmur, unspecified: Secondary | ICD-10-CM | POA: Diagnosis not present

## 2018-06-25 NOTE — Patient Instructions (Addendum)
Medication Instructions:  Continue current medications If you need a refill on your cardiac medications before your next appointment, please call your pharmacy.   Testing/Procedures: Your physician has requested that you have an echocardiogram. Echocardiography is a painless test that uses sound waves to create images of your heart. It provides your doctor with information about the size and shape of your heart and how well your heart's chambers and valves are working. This procedure takes approximately one hour. There are no restrictions for this procedure. -- done @ [address] - [address] Floor  Follow-Up: At [facility], you and your health needs are our priority.  As part of our continuing mission to provide you with exceptional heart care, we have created designated Provider Care Teams.  These Care Teams include your primary Cardiologist (physician) and Advanced Practice Providers (APPs -  Physician Assistants and Nurse Practitioners) who all work together to provide you with the care you need, when you need it. . You will need to follow up after your echocardiogram in a few weeks. This will be with either Dr. Rennis Golden or one of the Advanced Practice Providers (APPs) on his care team  o [place], [place] o [place], [place]  Any Other Special Instructions Will Be Listed Below (If Applicable).

## 2018-06-25 NOTE — Progress Notes (Signed)
OFFICE CONSULT NOTE  Chief Complaint:  Murmur, palpitations  Primary Care Physician: Gweneth Dimitri, MD  HPI:  Stacy Adkins is a 47 y.o. female who is being seen today for the evaluation of murmur, palpitations at the request of Gweneth Dimitri, MD.  This is a pleasant 47 year old female kindly referred for evaluation of heart murmur and palpitations.  Recently this was identified when she underwent laparoscopic gastric sleeve resection.  This was for primarily weight loss.  She says she is lost over 100 pounds and seems to continue to lose more.  She denies chest pain or worsening shortness of breath.  She does report a history of palpitations.  Previously she had felt this was related to anxiety.  She is also thought they were related to hypothyroidism.  She is on thyroid replacement.  She has a history of anxiety and depression as well as morbid obesity with weight loss surgery.  She takes medication for reflux and is on hydrochlorothiazide for hypertension.  She also is on clonidine but this is for hot flashes.  There is no history of significant early onset valvular heart disease in the family.  PMHx:  Past Medical History:  Diagnosis Date  . Depression   . Hypertension   . Thyroid disease     Past Surgical History:  Procedure Laterality Date  . ABDOMINAL HYSTERECTOMY    . FOOT SURGERY Bilateral   . LAPAROSCOPIC GASTRIC SLEEVE RESECTION    . SINUS IRRIGATION      FAMHx:  Family History  Problem Relation Age of Onset  . Congestive Heart Failure Mother   . Atrial fibrillation Mother   . ALS Father   . Congestive Heart Failure Maternal Grandmother     SOCHx:   reports that she has never smoked. She has never used smokeless tobacco. She reports that she drinks alcohol. She reports that she does not use drugs.  ALLERGIES:  Allergies  Allergen Reactions  . Sulfa Antibiotics     ROS: Pertinent items noted in HPI and remainder of comprehensive ROS otherwise  negative.  HOME MEDS: Current Outpatient Medications on File Prior to Visit  Medication Sig Dispense Refill  . cloNIDine (CATAPRES) 0.1 MG tablet Take 0.1 mg by mouth 2 (two) times daily.    . hydrochlorothiazide (HYDRODIURIL) 25 MG tablet Take 25 mg by mouth daily.    . lansoprazole (PREVACID) 15 MG capsule Take 15 mg by mouth daily at 12 noon.    [name] levothyroxine (SYNTHROID, LEVOTHROID) 100 MCG tablet Take 100 mcg by mouth daily before breakfast.    . venlafaxine XR (EFFEXOR-XR) 150 MG 24 hr capsule Take 1 capsule by mouth daily.  0   No current facility-administered medications on file prior to visit.     LABS/IMAGING: No results found for this or any previous visit (from the past 48 hour(s)). No results found.  LIPID PANEL: No results found for: CHOL, TRIG, HDL, CHOLHDL, VLDL, LDLCALC, LDLDIRECT  WEIGHTS: Wt Readings from Last 3 Encounters:  06/25/18 240 lb 9.6 oz (109.1 kg)  02/13/18 235 lb (106.6 kg)    VITALS: BP 126/70   Pulse 61   Ht 5\' 5"  (1.651 m)   Wt 240 lb 9.6 oz (109.1 kg) Comment: pt is wearing boot - says it weighs ~5-7 pounds  BMI 40.04 kg/m   EXAM: General appearance: alert and no distress Neck: no carotid bruit, no JVD and thyroid not enlarged, symmetric, no tenderness/mass/nodules Lungs: clear to auscultation bilaterally Heart: regular rate and rhythm,  S1, S2 normal and systolic murmur: early systolic 3/6, blowing at apex Abdomen: soft, non-tender; bowel sounds normal; no masses,  no organomegaly and obese Extremities: extremities normal, atraumatic, no cyanosis or edema and Left lower extremity in a walking boot Pulses: 2+ and symmetric Skin: Skin color, texture, turgor normal. No rashes or lesions Neurologic: Grossly normal Psych: Pleasant  EKG: Normal sinus rhythm at 61- personally reviewed  ASSESSMENT: 1. Murmur 2. Palpitations 3. Morbid obesity status post gastric sleeve surgery 4. Hypothyroidism  PLAN: 1.   [name] has murmur which  was recently identified prior to gastric sleeve surgery.  This is a systolic murmur heard best towards the apex and may be mitral regurgitation.  She has a history of palpitations which commonly is associated with mitral valve disease.  I would like to go ahead and obtain an echocardiogram baseline to further characterize this.  Plan to see her back to discuss those findings.  Thanks again for the kind referral.  Chrystie Nose, MD, [facility]  [place]  [facility]  Medical Director of the Advanced Lipid Disorders &  Cardiovascular Risk Reduction Clinic Diplomate of the American Board of Clinical Lipidology Attending Cardiologist  Direct Dial: [phone]  Fax: [phone]  Website:  [email or web address] 06/25/2018, 9:55 AM

## 2018-06-30 MED ORDER — PANTOPRAZOLE 40 MG TAB, DELAYED RELEASE
40 mg | ORAL_TABLET | ORAL | 1 refills | Status: AC
Start: 2018-06-30 — End: ?

## 2018-07-03 ENCOUNTER — Ambulatory Visit (HOSPITAL_COMMUNITY): Payer: 59 | Attending: Cardiovascular Disease

## 2018-07-03 ENCOUNTER — Other Ambulatory Visit: Payer: Self-pay

## 2018-07-03 DIAGNOSIS — R002 Palpitations: Secondary | ICD-10-CM

## 2018-07-03 DIAGNOSIS — R011 Cardiac murmur, unspecified: Secondary | ICD-10-CM

## 2018-07-06 DIAGNOSIS — S92301D Fracture of unspecified metatarsal bone(s), right foot, subsequent encounter for fracture with routine healing: Secondary | ICD-10-CM | POA: Diagnosis not present

## 2018-07-12 ENCOUNTER — Encounter: Payer: Self-pay | Admitting: Internal Medicine

## 2018-07-12 ENCOUNTER — Ambulatory Visit (INDEPENDENT_AMBULATORY_CARE_PROVIDER_SITE_OTHER): Payer: 59 | Admitting: Internal Medicine

## 2018-07-12 VITALS — BP 138/84 | HR 65 | Ht 65.0 in | Wt 238.0 lb

## 2018-07-12 DIAGNOSIS — I350 Nonrheumatic aortic (valve) stenosis: Secondary | ICD-10-CM

## 2018-07-12 DIAGNOSIS — I351 Nonrheumatic aortic (valve) insufficiency: Secondary | ICD-10-CM | POA: Diagnosis not present

## 2018-07-12 NOTE — Patient Instructions (Signed)
Medication Instructions:  Continue current medications If you need a refill on your cardiac medications before your next appointment, please call your pharmacy.   Testing/Procedures: Your physician has requested that you have an echocardiogram. Echocardiography is a painless test that uses sound waves to create images of your heart. It provides your doctor with information about the size and shape of your heart and how well your heart's chambers and valves are working. This procedure takes approximately one hour. There are no restrictions for this procedure. -- due in November 2021 -- [address]  Follow-Up: At [facility], you and your health needs are our priority.  As part of our continuing mission to provide you with exceptional heart care, we have created designated Provider Care Teams.  These Care Teams include your primary Cardiologist (physician) and Advanced Practice Providers (APPs -  Physician Assistants and Nurse Practitioners) who all work together to provide you with the care you need, when you need it. You will need a follow up appointment in 2 years.  Please call our office 2 months in advance to schedule this appointment.  You may see Chrystie Nose, MD or one of the following Advanced Practice Providers on your designated Care Team: [place], [place] . Micah Flesher, PA-C  Any Other Special Instructions Will Be Listed Below (If Applicable).

## 2018-07-12 NOTE — Progress Notes (Signed)
OFFICE CONSULT NOTE  Chief Complaint:  Follow-up echo  Primary Care Physician: Stacy Dimitri, MD  HPI:  Stacy Adkins is a 47 y.o. female who is being seen today for the evaluation of murmur, palpitations at the request of Stacy Dimitri, MD.  This is a pleasant 47 year old female kindly referred for evaluation of heart murmur and palpitations.  Recently this was identified when she underwent laparoscopic gastric sleeve resection.  This was for primarily weight loss.  She says she is lost over 100 pounds and seems to continue to lose more.  She denies chest pain or worsening shortness of breath.  She does report a history of palpitations.  Previously she had felt this was related to anxiety.  She is also thought they were related to hypothyroidism.  She is on thyroid replacement.  She has a history of anxiety and depression as well as morbid obesity with weight loss surgery.  She takes medication for reflux and is on hydrochlorothiazide for hypertension.  She also is on clonidine but this is for hot flashes.  There is no history of significant early onset valvular heart disease in the family.  07/12/2018  Stacy Adkins was seen today in follow-up.  She underwent an echocardiogram for evaluation of her heart murmur.  This showed normal LV systolic function with mild AS and mild AI.  I personally reviewed the images which show a trileaflet valve and some mild calcification of the right coronary cusp.  She does appear to have normal leaflet opening however her mean velocities are elevated.  Her AI appears more trace to mild.  She remains asymptomatic.  PMHx:  Past Medical History:  Diagnosis Date  . Depression   . Hypertension   . Thyroid disease     Past Surgical History:  Procedure Laterality Date  . ABDOMINAL HYSTERECTOMY    . FOOT SURGERY Bilateral   . LAPAROSCOPIC GASTRIC SLEEVE RESECTION    . SINUS IRRIGATION      FAMHx:  Family History  Problem Relation Age of Onset  . Congestive  Heart Failure Mother   . Atrial fibrillation Mother   . ALS Father   . Congestive Heart Failure Maternal Grandmother     SOCHx:   reports that she has never smoked. She has never used smokeless tobacco. She reports that she drinks alcohol. She reports that she does not use drugs.  ALLERGIES:  Allergies  Allergen Reactions  . Sulfa Antibiotics     ROS: Pertinent items noted in HPI and remainder of comprehensive ROS otherwise negative.  HOME MEDS: Current Outpatient Medications on File Prior to Visit  Medication Sig Dispense Refill  . cloNIDine (CATAPRES) 0.1 MG tablet Take 0.1 mg by mouth daily.     . hydrochlorothiazide (HYDRODIURIL) 25 MG tablet Take 25 mg by mouth daily.    . lansoprazole (PREVACID) 15 MG capsule Take 15 mg by mouth daily at 12 noon.    [name] levothyroxine (SYNTHROID, LEVOTHROID) 100 MCG tablet Take 100 mcg by mouth daily before breakfast.    . venlafaxine XR (EFFEXOR-XR) 150 MG 24 hr capsule Take 1 capsule by mouth daily.  0   No current facility-administered medications on file prior to visit.     LABS/IMAGING: No results found for this or any previous visit (from the past 48 hour(s)). No results found.  LIPID PANEL: No results found for: CHOL, TRIG, HDL, CHOLHDL, VLDL, LDLCALC, LDLDIRECT  WEIGHTS: Wt Readings from Last 3 Encounters:  07/12/18 238 lb (108 kg)  06/25/18  240 lb 9.6 oz (109.1 kg)  02/13/18 235 lb (106.6 kg)    VITALS: BP 138/84   Pulse 65   Ht 5\' 5"  (1.651 m)   Wt 238 lb (108 kg)   BMI 39.61 kg/m   EXAM: Deferred  EKG: Deferred  ASSESSMENT: 1. Murmur 2. Palpitations 3. Morbid obesity status post gastric sleeve surgery 4. Hypothyroidism  PLAN: 1.   Stacy Adkins was actually found to have an increased aortic valve velocity and possibly mild left ear as well as trace to mild AI.  There is some calcification of the right coronary cusp.  The valve appears to be trileaflet.  The etiology of this is unclear.  I suspect the murmur  may have been related to her AI reflection off the anterior mitral leaflet.  Based on these findings, I have no immediate concerns about her heart function.  I think that she will need follow-up however this valvular heart disease in probably 2 years with a repeat echo and I can see her back at that time.  In the meantime, I would continue to adequately treat blood pressure and other cardiac risk factors.  Stacy Nose, MD, [facility], FACP  [facility]  [facility]  Medical Director of the Advanced Lipid Disorders &  Cardiovascular Risk Reduction Clinic Diplomate of the American Board of Clinical Lipidology Attending Cardiologist  Direct Dial: [phone]  Fax: [phone]  Website:  [email or web address] 07/12/2018, 3:34 PM

## 2018-07-26 DIAGNOSIS — J0100 Acute maxillary sinusitis, unspecified: Secondary | ICD-10-CM | POA: Diagnosis not present

## 2018-07-27 DIAGNOSIS — S92301D Fracture of unspecified metatarsal bone(s), right foot, subsequent encounter for fracture with routine healing: Secondary | ICD-10-CM | POA: Diagnosis not present

## 2018-11-05 DIAGNOSIS — J111 Influenza due to unidentified influenza virus with other respiratory manifestations: Secondary | ICD-10-CM | POA: Diagnosis not present

## 2018-11-29 DIAGNOSIS — E039 Hypothyroidism, unspecified: Secondary | ICD-10-CM | POA: Diagnosis not present

## 2019-06-19 ENCOUNTER — Other Ambulatory Visit (HOSPITAL_COMMUNITY): Payer: Self-pay | Admitting: Family Medicine

## 2019-06-19 DIAGNOSIS — R011 Cardiac murmur, unspecified: Secondary | ICD-10-CM

## 2019-06-24 ENCOUNTER — Other Ambulatory Visit (HOSPITAL_COMMUNITY): Payer: 59

## 2019-07-08 ENCOUNTER — Other Ambulatory Visit: Payer: Self-pay | Admitting: Orthopaedic Surgery

## 2019-07-10 ENCOUNTER — Encounter (HOSPITAL_BASED_OUTPATIENT_CLINIC_OR_DEPARTMENT_OTHER): Payer: Self-pay | Admitting: *Deleted

## 2019-07-10 ENCOUNTER — Other Ambulatory Visit: Payer: Self-pay

## 2019-07-12 ENCOUNTER — Other Ambulatory Visit (HOSPITAL_COMMUNITY)
Admission: RE | Admit: 2019-07-12 | Discharge: 2019-07-12 | Disposition: A | Discharge status: Home / Self Care | Payer: 59 | Source: Ambulatory Visit | Attending: Orthopaedic Surgery | Admitting: Orthopaedic Surgery

## 2019-07-12 ENCOUNTER — Other Ambulatory Visit: Payer: Self-pay

## 2019-07-12 ENCOUNTER — Encounter (HOSPITAL_BASED_OUTPATIENT_CLINIC_OR_DEPARTMENT_OTHER)
Admission: RE | Admit: 2019-07-12 | Discharge: 2019-07-12 | Disposition: A | Discharge status: Home / Self Care | Payer: 59 | Source: Ambulatory Visit | Attending: Orthopaedic Surgery | Admitting: Orthopaedic Surgery

## 2019-07-12 DIAGNOSIS — S92351A Displaced fracture of fifth metatarsal bone, right foot, initial encounter for closed fracture: Secondary | ICD-10-CM | POA: Insufficient documentation

## 2019-07-12 DIAGNOSIS — Z01818 Encounter for other preprocedural examination: Secondary | ICD-10-CM | POA: Diagnosis present

## 2019-07-12 DIAGNOSIS — M79671 Pain in right foot: Secondary | ICD-10-CM | POA: Insufficient documentation

## 2019-07-12 DIAGNOSIS — S92321A Displaced fracture of second metatarsal bone, right foot, initial encounter for closed fracture: Secondary | ICD-10-CM | POA: Insufficient documentation

## 2019-07-12 DIAGNOSIS — Z20828 Contact with and (suspected) exposure to other viral communicable diseases: Secondary | ICD-10-CM | POA: Insufficient documentation

## 2019-07-12 DIAGNOSIS — M899 Disorder of bone, unspecified: Secondary | ICD-10-CM | POA: Insufficient documentation

## 2019-07-12 LAB — BASIC METABOLIC PANEL
Anion gap: 12 (ref 5–15)
BUN: 8 mg/dL (ref 6–20)
CO2: 26 mmol/L (ref 22–32)
Calcium: 8.9 mg/dL (ref 8.9–10.3)
Chloride: 96 mmol/L — ABNORMAL LOW (ref 98–111)
Creatinine, Ser: 0.9 mg/dL (ref 0.44–1.00)
GFR calc Af Amer: >60 mL/min (ref >60)
GFR calc non Af Amer: >60 mL/min (ref >60)
Glucose, Bld: 85 mg/dL (ref 70–99)
Potassium: 4.4 mmol/L (ref 3.5–5.1)
Sodium: 134 mmol/L — ABNORMAL LOW (ref 135–145)

## 2019-07-12 NOTE — Progress Notes (Signed)

## 2019-07-13 LAB — NOVEL CORONAVIRUS, NAA (HOSP ORDER, SEND-OUT TO REF LAB; TAT 18-24 HRS): SARS-CoV-2, NAA: NOT DETECTED

## 2019-07-15 ENCOUNTER — Other Ambulatory Visit (HOSPITAL_COMMUNITY): Payer: 59

## 2019-07-16 ENCOUNTER — Encounter (HOSPITAL_BASED_OUTPATIENT_CLINIC_OR_DEPARTMENT_OTHER)
Admission: RE | Disposition: A | Discharge status: Home / Self Care | Payer: Self-pay | Source: Home / Self Care | Attending: Orthopaedic Surgery

## 2019-07-16 ENCOUNTER — Ambulatory Visit (HOSPITAL_BASED_OUTPATIENT_CLINIC_OR_DEPARTMENT_OTHER)
Admission: RE | Admit: 2019-07-16 | Discharge: 2019-07-16 | Disposition: A | Discharge status: Home / Self Care | Payer: 59 | Attending: Orthopaedic Surgery | Admitting: Orthopaedic Surgery

## 2019-07-16 ENCOUNTER — Encounter (HOSPITAL_BASED_OUTPATIENT_CLINIC_OR_DEPARTMENT_OTHER): Payer: Self-pay

## 2019-07-16 ENCOUNTER — Other Ambulatory Visit: Payer: Self-pay

## 2019-07-16 ENCOUNTER — Ambulatory Visit (HOSPITAL_BASED_OUTPATIENT_CLINIC_OR_DEPARTMENT_OTHER): Payer: 59 | Admitting: Anesthesiology

## 2019-07-16 DIAGNOSIS — T8484XA Pain due to internal orthopedic prosthetic devices, implants and grafts, initial encounter: Secondary | ICD-10-CM | POA: Diagnosis not present

## 2019-07-16 DIAGNOSIS — M25871 Other specified joint disorders, right ankle and foot: Secondary | ICD-10-CM | POA: Diagnosis not present

## 2019-07-16 DIAGNOSIS — M899 Disorder of bone, unspecified: Secondary | ICD-10-CM | POA: Diagnosis not present

## 2019-07-16 DIAGNOSIS — S92351A Displaced fracture of fifth metatarsal bone, right foot, initial encounter for closed fracture: Secondary | ICD-10-CM | POA: Diagnosis present

## 2019-07-16 DIAGNOSIS — Z9884 Bariatric surgery status: Secondary | ICD-10-CM | POA: Diagnosis not present

## 2019-07-16 DIAGNOSIS — Z79899 Other long term (current) drug therapy: Secondary | ICD-10-CM | POA: Diagnosis not present

## 2019-07-16 DIAGNOSIS — Y792 Prosthetic and other implants, materials and accessory orthopedic devices associated with adverse incidents: Secondary | ICD-10-CM | POA: Diagnosis not present

## 2019-07-16 DIAGNOSIS — Z882 Allergy status to sulfonamides status: Secondary | ICD-10-CM | POA: Insufficient documentation

## 2019-07-16 DIAGNOSIS — I10 Essential (primary) hypertension: Secondary | ICD-10-CM | POA: Diagnosis not present

## 2019-07-16 DIAGNOSIS — E039 Hypothyroidism, unspecified: Secondary | ICD-10-CM | POA: Diagnosis not present

## 2019-07-16 DIAGNOSIS — K219 Gastro-esophageal reflux disease without esophagitis: Secondary | ICD-10-CM | POA: Diagnosis not present

## 2019-07-16 DIAGNOSIS — G4730 Sleep apnea, unspecified: Secondary | ICD-10-CM | POA: Diagnosis not present

## 2019-07-16 DIAGNOSIS — M65871 Other synovitis and tenosynovitis, right ankle and foot: Secondary | ICD-10-CM | POA: Diagnosis not present

## 2019-07-16 DIAGNOSIS — F419 Anxiety disorder, unspecified: Secondary | ICD-10-CM | POA: Diagnosis not present

## 2019-07-16 DIAGNOSIS — X501XXA Overexertion from prolonged static or awkward postures, initial encounter: Secondary | ICD-10-CM | POA: Insufficient documentation

## 2019-07-16 DIAGNOSIS — Z6839 Body mass index (BMI) 39.0-39.9, adult: Secondary | ICD-10-CM | POA: Insufficient documentation

## 2019-07-16 DIAGNOSIS — Z79890 Hormone replacement therapy (postmenopausal): Secondary | ICD-10-CM | POA: Insufficient documentation

## 2019-07-16 DIAGNOSIS — Z8249 Family history of ischemic heart disease and other diseases of the circulatory system: Secondary | ICD-10-CM | POA: Insufficient documentation

## 2019-07-16 DIAGNOSIS — F329 Major depressive disorder, single episode, unspecified: Secondary | ICD-10-CM | POA: Diagnosis not present

## 2019-07-16 HISTORY — PX: HARDWARE REMOVAL: SHX979

## 2019-07-16 HISTORY — DX: Gastro-esophageal reflux disease without esophagitis: K21.9

## 2019-07-16 HISTORY — PX: REPAIR EXTENSOR TENDON WITH METATARSAL OSTEOTOMY AND OPEN REDUCTION IN: SHX5698

## 2019-07-16 HISTORY — DX: Hypothyroidism, unspecified: E03.9

## 2019-07-16 HISTORY — DX: Displaced fracture of fifth metatarsal bone, right foot, initial encounter for open fracture: S92.351B

## 2019-07-16 HISTORY — DX: Sleep apnea, unspecified: G47.30

## 2019-07-16 HISTORY — DX: Anxiety disorder, unspecified: F41.9

## 2019-07-16 SURGERY — REPAIR EXTENSOR TENDON WITH METATARSAL OSTEOTOMY AND OPEN REDUCTION INTERNAL FIXATION (ORIF) METATARSAL
Anesthesia: Regional | Site: Foot | Laterality: Right

## 2019-07-16 MED ORDER — MIDAZOLAM HCL 2 MG/2ML IJ SOLN
1.0000 mg | INTRAMUSCULAR | Status: DC | PRN
  Administered 2019-07-16: 2 mg via INTRAVENOUS

## 2019-07-16 MED ORDER — FENTANYL CITRATE (PF) 100 MCG/2ML IJ SOLN
50.0000 ug | INTRAMUSCULAR | Status: DC | PRN
  Administered 2019-07-16: 100 ug via INTRAVENOUS

## 2019-07-16 MED ORDER — OXYCODONE HCL 5 MG PO TABS: 5.0000 mg | ORAL_TABLET | ORAL | 0 refills | Status: AC | PRN

## 2019-07-16 MED ORDER — ROPIVACAINE HCL 5 MG/ML IJ SOLN
INTRAMUSCULAR | Status: DC | PRN
  Administered 2019-07-16: 30 mL via PERINEURAL

## 2019-07-16 MED ORDER — MIDAZOLAM HCL 2 MG/2ML IJ SOLN
INTRAMUSCULAR | Status: AC
  Filled 2019-07-16: qty 2

## 2019-07-16 MED ORDER — FENTANYL CITRATE (PF) 100 MCG/2ML IJ SOLN
INTRAMUSCULAR | Status: AC
  Filled 2019-07-16: qty 2

## 2019-07-16 MED ORDER — FENTANYL CITRATE (PF) 100 MCG/2ML IJ SOLN: 25.0000 ug | INTRAMUSCULAR | Status: DC | PRN

## 2019-07-16 MED ORDER — CEFAZOLIN SODIUM-DEXTROSE 2-4 GM/100ML-% IV SOLN
INTRAVENOUS | Status: AC
  Filled 2019-07-16: qty 100

## 2019-07-16 MED ORDER — LACTATED RINGERS IV SOLN
INTRAVENOUS | Status: DC
  Administered 2019-07-16 (×2): via INTRAVENOUS

## 2019-07-16 MED ORDER — ACETAMINOPHEN 500 MG PO TABS: 1000.0000 mg | ORAL_TABLET | Freq: Once | ORAL | Status: DC

## 2019-07-16 MED ORDER — ONDANSETRON HCL 4 MG/2ML IJ SOLN
INTRAMUSCULAR | Status: DC | PRN
  Administered 2019-07-16: 4 mg via INTRAVENOUS

## 2019-07-16 MED ORDER — OXYCODONE HCL 5 MG PO TABS
5.0000 mg | ORAL_TABLET | Freq: Once | ORAL | Status: AC
  Administered 2019-07-16: 5 mg via ORAL

## 2019-07-16 MED ORDER — PROPOFOL 10 MG/ML IV BOLUS
INTRAVENOUS | Status: AC
  Filled 2019-07-16: qty 20

## 2019-07-16 MED ORDER — POVIDONE-IODINE 10 % EX SWAB
2.0000 "application " | Freq: Once | CUTANEOUS | Status: AC
  Administered 2019-07-16: 2 via TOPICAL

## 2019-07-16 MED ORDER — DEXAMETHASONE SODIUM PHOSPHATE 10 MG/ML IJ SOLN
INTRAMUSCULAR | Status: AC
  Filled 2019-07-16: qty 1

## 2019-07-16 MED ORDER — CEFAZOLIN SODIUM-DEXTROSE 2-4 GM/100ML-% IV SOLN
2.0000 g | INTRAVENOUS | Status: AC
  Administered 2019-07-16: 2 g via INTRAVENOUS

## 2019-07-16 MED ORDER — ACETAMINOPHEN 500 MG PO TABS
ORAL_TABLET | ORAL | Status: AC
  Filled 2019-07-16: qty 2

## 2019-07-16 MED ORDER — ONDANSETRON HCL 4 MG/2ML IJ SOLN
INTRAMUSCULAR | Status: AC
  Filled 2019-07-16: qty 2

## 2019-07-16 MED ORDER — LIDOCAINE 2% (20 MG/ML) 5 ML SYRINGE
INTRAMUSCULAR | Status: AC
  Filled 2019-07-16: qty 5

## 2019-07-16 MED ORDER — ACETAMINOPHEN 500 MG PO TABS
1000.0000 mg | ORAL_TABLET | Freq: Once | ORAL | Status: AC
  Administered 2019-07-16: 1000 mg via ORAL

## 2019-07-16 MED ORDER — PROPOFOL 10 MG/ML IV BOLUS
INTRAVENOUS | Status: DC | PRN
  Administered 2019-07-16: 200 mg via INTRAVENOUS

## 2019-07-16 MED ORDER — OXYCODONE HCL 5 MG PO TABS
ORAL_TABLET | ORAL | Status: AC
  Filled 2019-07-16: qty 1

## 2019-07-16 MED ORDER — MIDAZOLAM HCL 5 MG/5ML IJ SOLN
INTRAMUSCULAR | Status: DC | PRN
  Administered 2019-07-16: 2 mg via INTRAVENOUS

## 2019-07-16 MED ORDER — DEXAMETHASONE SODIUM PHOSPHATE 4 MG/ML IJ SOLN
INTRAMUSCULAR | Status: DC | PRN
  Administered 2019-07-16: 10 mg via INTRAVENOUS

## 2019-07-16 MED ORDER — PROPOFOL 500 MG/50ML IV EMUL
INTRAVENOUS | Status: DC | PRN
  Administered 2019-07-16: 25 ug/kg/min via INTRAVENOUS

## 2019-07-16 MED ORDER — LIDOCAINE HCL (CARDIAC) PF 100 MG/5ML IV SOSY
PREFILLED_SYRINGE | INTRAVENOUS | Status: DC | PRN
  Administered 2019-07-16: 75 mg via INTRAVENOUS

## 2019-07-16 MED ORDER — CLONIDINE HCL (ANALGESIA) 100 MCG/ML EP SOLN
EPIDURAL | Status: DC | PRN
  Administered 2019-07-16: 100 ug

## 2019-07-16 SURGICAL SUPPLY — 79 items
BANDAGE ESMARK 6X9 LF (GAUZE/BANDAGES/DRESSINGS) IMPLANT
BENZOIN TINCTURE PRP APPL 2/3 (GAUZE/BANDAGES/DRESSINGS) IMPLANT
BIT DRILL CANN 3.5 (BIT) ×1
BIT DRILL SRG 3.5XCAN FFTH MTR (BIT) ×1 IMPLANT
BIT DRL SRG 3.5XCANN FIFTH MTR (BIT) ×1
BLADE SURG 15 STRL LF DISP TIS (BLADE) ×4 IMPLANT
BLADE SURG 15 STRL SS (BLADE) ×4
BNDG COHESIVE 4X5 TAN STRL (GAUZE/BANDAGES/DRESSINGS) IMPLANT
BNDG ELASTIC 4X5.8 VLCR STR LF (GAUZE/BANDAGES/DRESSINGS) IMPLANT
BNDG ELASTIC 6X5.8 VLCR STR LF (GAUZE/BANDAGES/DRESSINGS) IMPLANT
BNDG ESMARK 4X9 LF (GAUZE/BANDAGES/DRESSINGS) ×2 IMPLANT
BNDG ESMARK 6X9 LF (GAUZE/BANDAGES/DRESSINGS)
CHLORAPREP W/TINT 26 (MISCELLANEOUS) ×4 IMPLANT
COVER BACK TABLE REUSABLE LG (DRAPES) ×2 IMPLANT
COVER SURGICAL LIGHT HANDLE (MISCELLANEOUS) ×2 IMPLANT
COVER WAND RF STERILE (DRAPES) IMPLANT
CUFF TOURN SGL QUICK 34 (TOURNIQUET CUFF)
CUFF TRNQT CYL 34X4.125X (TOURNIQUET CUFF) IMPLANT
DECANTER SPIKE VIAL GLASS SM (MISCELLANEOUS) IMPLANT
DRAPE C-ARM 42X72 X-RAY (DRAPES) ×2 IMPLANT
DRAPE C-ARMOR (DRAPES) IMPLANT
DRAPE EXTREMITY T 121X128X90 (DISPOSABLE) ×2 IMPLANT
DRAPE HALF SHEET 70X43 (DRAPES) ×2 IMPLANT
DRAPE IMP U-DRAPE 54X76 (DRAPES) ×2 IMPLANT
DRAPE OEC MINIVIEW 54X84 (DRAPES) ×2 IMPLANT
DRAPE U-SHAPE 47X51 STRL (DRAPES) ×2 IMPLANT
DRSG PAD ABDOMINAL 8X10 ST (GAUZE/BANDAGES/DRESSINGS) IMPLANT
ELECT REM PT RETURN 9FT ADLT (ELECTROSURGICAL) ×2
ELECTRODE REM PT RTRN 9FT ADLT (ELECTROSURGICAL) ×1 IMPLANT
GAUZE SPONGE 4X4 12PLY STRL (GAUZE/BANDAGES/DRESSINGS) ×2 IMPLANT
GAUZE XEROFORM 1X8 LF (GAUZE/BANDAGES/DRESSINGS) ×2 IMPLANT
GLOVE BIO SURGEON STRL SZ7 (GLOVE) ×2 IMPLANT
GLOVE BIOGEL M 6.5 STRL (GLOVE) ×2 IMPLANT
GLOVE BIOGEL M 7.0 STRL (GLOVE) ×2 IMPLANT
GLOVE BIOGEL M STRL SZ7.5 (GLOVE) ×2 IMPLANT
GLOVE BIOGEL PI IND STRL 7.5 (GLOVE) ×2 IMPLANT
GLOVE BIOGEL PI IND STRL 8 (GLOVE) ×1 IMPLANT
GLOVE BIOGEL PI INDICATOR 7.5 (GLOVE) ×2
GLOVE BIOGEL PI INDICATOR 8 (GLOVE) ×1
GOWN STRL REUS W/ TWL LRG LVL3 (GOWN DISPOSABLE) ×1 IMPLANT
GOWN STRL REUS W/ TWL XL LVL3 (GOWN DISPOSABLE) ×1 IMPLANT
GOWN STRL REUS W/TWL LRG LVL3 (GOWN DISPOSABLE) ×1
GOWN STRL REUS W/TWL XL LVL3 (GOWN DISPOSABLE) ×1
GUIDEWIRE .07X8 (WIRE) ×2 IMPLANT
NDL SAFETY ECLIPSE 18X1.5 (NEEDLE) ×1 IMPLANT
NEEDLE HYPO 18GX1.5 SHARP (NEEDLE) ×1
NEEDLE HYPO 25X1 1.5 SAFETY (NEEDLE) IMPLANT
NS IRRIG 1000ML POUR BTL (IV SOLUTION) ×2 IMPLANT
PACK BASIN DAY SURGERY FS (CUSTOM PROCEDURE TRAY) ×2 IMPLANT
PAD CAST 4YDX4 CTTN HI CHSV (CAST SUPPLIES) ×1 IMPLANT
PADDING CAST COTTON 4X4 STRL (CAST SUPPLIES) ×1
PADDING CAST SYNTHETIC 4 (CAST SUPPLIES) ×1
PADDING CAST SYNTHETIC 4X4 STR (CAST SUPPLIES) ×1 IMPLANT
PENCIL SMOKE EVACUATOR (MISCELLANEOUS) ×2 IMPLANT
SCREW 5.5MMX45MM (Screw) ×2 IMPLANT
SLEEVE SCD COMPRESS KNEE MED (MISCELLANEOUS) ×2 IMPLANT
SPLINT FAST PLASTER 5X30 (CAST SUPPLIES)
SPLINT FIBERGLASS 4X30 (CAST SUPPLIES) ×2 IMPLANT
SPLINT PLASTER CAST FAST 5X30 (CAST SUPPLIES) IMPLANT
SPONGE LAP 18X18 RF (DISPOSABLE) IMPLANT
STOCKINETTE 6  STRL (DRAPES) ×1
STOCKINETTE 6 STRL (DRAPES) ×1 IMPLANT
STRIP CLOSURE SKIN 1/2X4 (GAUZE/BANDAGES/DRESSINGS) IMPLANT
SUCTION FRAZIER HANDLE 10FR (MISCELLANEOUS) ×1
SUCTION TUBE FRAZIER 10FR DISP (MISCELLANEOUS) ×1 IMPLANT
SUT ETHILON 3 0 PS 1 (SUTURE) ×2 IMPLANT
SUT FIBERWIRE 2-0 18 17.9 3/8 (SUTURE)
SUT MNCRL AB 3-0 PS2 18 (SUTURE) ×2 IMPLANT
SUT PDS AB 2-0 CT2 27 (SUTURE) ×2 IMPLANT
SUT VIC AB 3-0 FS2 27 (SUTURE) IMPLANT
SUTURE FIBERWR 2-0 18 17.9 3/8 (SUTURE) IMPLANT
SYR 10ML LL (SYRINGE) ×2 IMPLANT
SYR BULB 3OZ (MISCELLANEOUS) ×2 IMPLANT
SYR CONTROL 10ML LL (SYRINGE) IMPLANT
TAP BONE CANN 4.5MM (BIT) ×2 IMPLANT
TAP CANN 5.5MM (BIT) ×2 IMPLANT
TOWEL GREEN STERILE FF (TOWEL DISPOSABLE) ×4 IMPLANT
TUBE CONNECTING 20X1/4 (TUBING) ×2 IMPLANT
UNDERPAD 30X36 HEAVY ABSORB (UNDERPADS AND DIAPERS) ×2 IMPLANT

## 2019-07-16 NOTE — Anesthesia Preprocedure Evaluation (Addendum)
Anesthesia Evaluation  Patient identified by MRN, date of birth, ID band Patient awake    Reviewed: Allergy & Precautions, NPO status , Patient's Chart, lab work & pertinent test results  Airway Mallampati: III  TM Distance: >3 FB Neck ROM: Full    Dental no notable dental hx. (+) Teeth Intact, Dental Advisory Given   Pulmonary sleep apnea ,    Pulmonary exam normal breath sounds clear to auscultation       Cardiovascular hypertension, negative cardio ROS Normal cardiovascular exam Rhythm:Regular Rate:Normal  TTE 2019 LVEF 60-65%, mild AS and AI   Neuro/Psych PSYCHIATRIC DISORDERS Anxiety Depression negative neurological ROS     GI/Hepatic Neg liver ROS, GERD  ,  Endo/Other  Hypothyroidism Morbid obesity  Renal/GU negative Renal ROS  negative genitourinary   Musculoskeletal negative musculoskeletal ROS (+)   Abdominal   Peds  Hematology negative hematology ROS (+)   Anesthesia Other Findings   Reproductive/Obstetrics                            Anesthesia Physical Anesthesia Plan  ASA: III  Anesthesia Plan: Regional and General   Post-op Pain Management:  Regional for Post-op pain   Induction: Intravenous  PONV Risk Score and Plan: 3 and Propofol infusion, Treatment may vary due to age or medical condition, Ondansetron, Dexamethasone and Midazolam  Airway Management Planned: LMA  Additional Equipment:   Intra-op Plan:   Post-operative Plan: Extubation in OR  Informed Consent: I have reviewed the patients History and Physical, chart, labs and discussed the procedure including the risks, benefits and alternatives for the proposed anesthesia with the patient or authorized representative who has indicated his/her understanding and acceptance.     Dental advisory given  Plan Discussed with: CRNA  Anesthesia Plan Comments:        Anesthesia Quick Evaluation

## 2019-07-16 NOTE — Progress Notes (Signed)
Assisted Dr. Woodrum with right, ultrasound guided, popliteal block. Side rails up, monitors on throughout procedure. See vital signs in flow sheet. Tolerated Procedure well. 

## 2019-07-16 NOTE — Anesthesia Postprocedure Evaluation (Signed)
Anesthesia Post Note  Patient: Stacy Adkins  Procedure(s) Performed: REPAIR EXTENSOR TENDON WITH METATARSAL OSTEOTOMY AND OPEN REDUCTION INTERNAL FIXATION (ORIF) METATARSAL, 2ND MTP CAPSULOTOMY AND DEBRIDEMENT, PARTIAL RESECTION OF PHALANX (Right Foot) HARDWARE REMOVAL (Right Foot)     Patient location during evaluation: PACU Anesthesia Type: Regional and General Level of consciousness: awake and alert Pain management: pain level controlled Vital Signs Assessment: post-procedure vital signs reviewed and stable Respiratory status: spontaneous breathing, nonlabored ventilation, respiratory function stable and patient connected to nasal cannula oxygen Cardiovascular status: blood pressure returned to baseline and stable Postop Assessment: no apparent nausea or vomiting Anesthetic complications: no    Last Vitals:  Vitals:   07/16/19 1245 07/16/19 1315  BP: 136/87 132/74  Pulse: 70 78  Resp: (!) 22 20  Temp:  36.8 C  SpO2: 98% 99%    Last Pain:  Vitals:   07/16/19 1315  TempSrc:   PainSc: 0-No pain                 Clements Toro L Nayelie Gionfriddo

## 2019-07-16 NOTE — Discharge Instructions (Signed)
DR. Susa Simmonds FOOT & ANKLE SURGERY POST-OP INSTRUCTIONS   Pain Management 1. The numbing medicine and your leg will last around 18 hours, take a dose of your pain medicine as soon as you feel it wearing off to avoid rebound pain. 2. Keep your foot elevated above heart level.  Make sure that your heel hangs free ('floats'). 3. Take all prescribed medication as directed. 4. If taking narcotic pain medication you may want to use an over-the-counter stool softener to avoid constipation. 5. You may take over-the-counter NSAIDs (ibuprofen, naproxen, etc.) as well as over-the-counter acetaminophen as directed on the packaging as a supplement for your pain and may also use it to wean away from the prescription medication.  Activity ? Non-weightbearing ? Keep splint intact  First Postoperative Visit 1. Your first postop visit will be at least 2 weeks after surgery.  This should be scheduled when you schedule surgery. 2. If you do not have a postoperative visit scheduled please call [phone] to schedule an appointment. 3. At the appointment your incision will be evaluated for suture removal, x-rays will be obtained if necessary.  General Instructions 1. Swelling is very common after foot and ankle surgery.  It often takes 3 months for the foot and ankle to begin to feel comfortable.  Some amount of swelling will persist for 6-12 months. 2. DO NOT change the dressing.  If there is a problem with the dressing (too tight, loose, gets wet, etc.) please contact Dr. Donnie Mesa office. 3. DO NOT get the dressing wet.  For showers you can use an over-the-counter cast cover or wrap a washcloth around the top of your dressing and then cover it with a plastic bag and tape it to your leg. 4. DO NOT soak the incision (no tubs, pools, bath, etc.) until you have approval from Dr. Susa Simmonds.  Contact Dr. Garret Reddish office or go to Emergency Room if: 1. Temperature above 101 F. 2. Increasing pain that is unresponsive to pain  medication or elevation 3. Excessive redness or swelling in your foot 4. Dressing problems - excessive bloody drainage, looseness or tightness, or if dressing gets wet 5. Develop pain, swelling, warmth, or discoloration of your calf   No tylenol until after 3:30 today.   Post Anesthesia Home Care Instructions  Activity: Get plenty of rest for the remainder of the day. A responsible individual must stay with you for 24 hours following the procedure.  For the next 24 hours, DO NOT: -Drive a car -Advertising copywriter -Drink alcoholic beverages -Take any medication unless instructed by your physician -Make any legal decisions or sign important papers.  Meals: Start with liquid foods such as gelatin or soup. Progress to regular foods as tolerated. Avoid greasy, spicy, heavy foods. If nausea and/or vomiting occur, drink only clear liquids until the nausea and/or vomiting subsides. Call your physician if vomiting continues.  Special Instructions/Symptoms: Your throat may feel dry or sore from the anesthesia or the breathing tube placed in your throat during surgery. If this causes discomfort, gargle with warm salt water. The discomfort should disappear within 24 hours.  If you had a scopolamine patch placed behind your ear for the management of post- operative nausea and/or vomiting:  1. The medication in the patch is effective for 72 hours, after which it should be removed.  Wrap patch in a tissue and discard in the trash. Wash hands thoroughly with soap and water. 2. You may remove the patch earlier than 72 hours if you experience unpleasant  side effects which may include dry mouth, dizziness or visual disturbances. 3. Avoid touching the patch. Wash your hands with soap and water after contact with the patch.   Regional Anesthesia Blocks  1. Numbness or the inability to move the "blocked" extremity may last from 3-48 hours after placement. The length of time depends on the medication  injected and your individual response to the medication. If the numbness is not going away after 48 hours, call your surgeon.  2. The extremity that is blocked will need to be protected until the numbness is gone and the  Strength has returned. Because you cannot feel it, you will need to take extra care to avoid injury. Because it may be weak, you may have difficulty moving it or using it. You may not know what position it is in without looking at it while the block is in effect.  3. For blocks in the legs and feet, returning to weight bearing and walking needs to be done carefully. You will need to wait until the numbness is entirely gone and the strength has returned. You should be able to move your leg and foot normally before you try and bear weight or walk. You will need someone to be with you when you first try to ensure you do not fall and possibly risk injury.  4. Bruising and tenderness at the needle site are common side effects and will resolve in a few days.  5. Persistent numbness or new problems with movement should be communicated to the surgeon or the [facility] [phone] [place] [phone]).

## 2019-07-16 NOTE — Anesthesia Procedure Notes (Signed)
Procedure Name: LMA Insertion Date/Time: 07/16/2019 11:08 AM Performed by: Marrianne Mood, CRNA Pre-anesthesia Checklist: Patient identified, Emergency Drugs available, Suction available, Patient being monitored and Timeout performed Patient Re-evaluated:Patient Re-evaluated prior to induction Oxygen Delivery Method: Circle system utilized Preoxygenation: Pre-oxygenation with 100% oxygen Induction Type: IV induction Ventilation: Mask ventilation without difficulty LMA: LMA inserted LMA Size: 4.0 Number of attempts: 1 Airway Equipment and Method: Bite block Placement Confirmation: positive ETCO2 Tube secured with: Tape Dental Injury: Teeth and Oropharynx as per pre-operative assessment

## 2019-07-16 NOTE — H&P (Signed)
Stacy Adkins is an 48 y.o. female.   Chief Complaint: Right fifth metatarsal base fracture, second MTP stiffness with exostosis HPI: Stacy Adkins tripped and sustained the above injury.  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  She has continued foot pain.  She is here to have it fixed.  In the clinic she discussed discomfort in her second MTP joint due to prior bunion and hammertoe correction with impingement with attempted extension and pain.  She failed conservative treatment the form of shoe modifications activity modifications and anti-inflammatory use and wishes to have this joint addressed as well.  She denies any recent fevers or chills.  She has been maintaining nonweightbearing.  Past Medical History:  Diagnosis Date  . Anxiety   . Depression   . Displaced fracture of fifth metatarsal bone, right foot, initial encounter for open fracture   . GERD (gastroesophageal reflux disease)   . Hypertension   . Hypothyroidism   . Sleep apnea    lost 100lb with gastric sleeve, no longer uses CPAP  . Thyroid disease     Past Surgical History:  Procedure Laterality Date  . ABDOMINAL HYSTERECTOMY    . FOOT SURGERY Bilateral   . LAPAROSCOPIC GASTRIC SLEEVE RESECTION    . SINUS IRRIGATION      Family History  Problem Relation Age of Onset  . Congestive Heart Failure Mother   . Atrial fibrillation Mother   . ALS Father   . Congestive Heart Failure Maternal Grandmother    Social History:  reports that she has never smoked. She has never used smokeless tobacco. She reports current alcohol use. She reports that she does not use drugs.  Allergies:  Allergies  Allergen Reactions  . Sulfa Antibiotics Swelling    Swelling in hands, hives    Medications Prior to Admission  Medication Sig Dispense Refill  . clonazePAM (KLONOPIN) 0.5 MG tablet Take 0.5 mg by mouth 2 (two)  times daily as needed for anxiety.    [name] levothyroxine (SYNTHROID) 50 MCG tablet Take 50 mcg by mouth daily before breakfast.    . pantoprazole (PROTONIX) 40 MG tablet Take 40 mg by mouth daily.    [name] spironolactone (ALDACTONE) 25 MG tablet Take 25 mg by mouth daily.    . traMADol (ULTRAM) 50 MG tablet Take by mouth every 6 (six) hours as needed.    . venlafaxine (EFFEXOR) 100 MG tablet Take 225 mg by mouth 2 (two) times daily.      No results found for this or any previous visit (from the past 48 hour(s)). No results found.  Review of Systems  Constitutional: Negative.   HENT: Negative.   Respiratory: Negative.   Cardiovascular: Negative.   Gastrointestinal: Negative.   Musculoskeletal:       Right foot pain  Skin: Negative.   Neurological: Negative.   Psychiatric/Behavioral: Negative.     Blood pressure (!) 149/94, pulse 70, temperature 98.3 F (36.8 C), temperature source Oral, resp. rate 16, height 5\' 5"  (1.651 m), weight 109 kg, SpO2 99 %. Physical Exam  Vitals reviewed. Constitutional: She appears well-developed.  HENT:  Head: Normocephalic.  Eyes: Conjunctivae are normal.  Neck: Neck supple.  Cardiovascular: Normal rate.  Respiratory: Effort normal.  GI: Soft.  Musculoskeletal:     Comments: Right foot demonstrates swelling and ecchymosis laterally.  Tenderness palpation of the base of the fifth metatarsal.  Also some  tenderness at the second MTP joint with stiffness with attempted extension.  Toe alignment is acceptable.  Sensation intact in the dorsal plantar foot.  Foot is warm and well-perfused.  Neurological: She is alert.  Skin: Skin is warm.  Psychiatric: She has a normal mood and affect.     Assessment/Plan We will plan to proceed with open treatment of her right fifth metatarsal base fracture.  We will also perform second MTP capsulotomy and condylectomy with hardware removal.  She understands the risk benefits alternatives surgery which include but not  limited to wound healing complications, infection, nonunion, malunion, need for further surgery as well as damage to surrounding structures and continued pain.  She also understands the perioperative and anesthetic risk which include death.  She understands the postoperative weightbearing restrictions and agrees to comply.  We will proceed.  Erle Crocker, MD 07/16/2019, 10:28 AM

## 2019-07-16 NOTE — Anesthesia Procedure Notes (Signed)
Anesthesia Regional Block: Popliteal block   Pre-Anesthetic Checklist: ,, timeout performed, Correct Patient, Correct Site, Correct Laterality, Correct Procedure, Correct Position, site marked, Risks and benefits discussed,  Surgical consent,  Pre-op evaluation,  At surgeon's request and post-op pain management  Laterality: Right  Prep: Maximum Sterile Barrier Precautions used, chloraprep       Needles:  Injection technique: Single-shot  Needle Type: Echogenic Stimulator Needle     Needle Length: 9cm  Needle Gauge: 22     Additional Needles:   Procedures:,,,, ultrasound used (permanent image in chart),,,,  Narrative:  Start time: 07/16/2019 10:35 AM End time: 07/16/2019 10:45 AM Injection made incrementally with aspirations every 5 mL.  Performed by: Personally  Anesthesiologist: Freddrick March, MD  Additional Notes: Monitors applied. No increased pain on injection. No increased resistance to injection. Injection made in 5cc increments. Good needle visualization. Patient tolerated procedure well.

## 2019-07-16 NOTE — Transfer of Care (Signed)
Immediate Anesthesia Transfer of Care Note  Patient: Stacy Adkins  Procedure(s) Performed: REPAIR EXTENSOR TENDON WITH METATARSAL OSTEOTOMY AND OPEN REDUCTION INTERNAL FIXATION (ORIF) METATARSAL, 2ND MTP CAPSULOTOMY AND DEBRIDEMENT, PARTIAL RESECTION OF PHALANX (Right Foot) HARDWARE REMOVAL (Right Foot)  Patient Location: PACU  Anesthesia Type:GA combined with regional for post-op pain  Level of Consciousness: awake and patient cooperative  Airway & Oxygen Therapy: Patient Spontanous Breathing and Patient connected to face mask oxygen  Post-op Assessment: Report given to RN and Post -op Vital signs reviewed and stable  Post vital signs: Reviewed and stable  Last Vitals:  Vitals Value Taken Time  BP    Temp    Pulse    Resp 13 07/16/19 1212  SpO2    Vitals shown include unvalidated device data.  Last Pain:  Vitals:   07/16/19 0942  TempSrc: Oral  PainSc: 6       Patients Stated Pain Goal: 4 (54/27/06 2376)  Complications: No apparent anesthesia complications

## 2019-07-17 ENCOUNTER — Encounter (HOSPITAL_BASED_OUTPATIENT_CLINIC_OR_DEPARTMENT_OTHER): Payer: Self-pay | Admitting: Orthopaedic Surgery

## 2019-07-17 NOTE — Addendum Note (Signed)
Addendum  created 07/17/19 1106 by Tawni Millers, CRNA   Charge Capture section accepted

## 2019-07-17 NOTE — Op Note (Addendum)
Stacy Adkins female 48 y.o. 07/16/2019  PreOperative Diagnosis: Right fifth metatarsal base fracture, zone 2. Symptomatic retained orthopedic hardware, second metatarsal Second metatarsal phalangeal joint impingement Second metatarsal head condylar exostosis Extensor digitorum longus to the second toe adhesions  PostOperative Diagnosis: Same  PROCEDURE: Open treatment of right fifth metatarsal base fracture Deep hardware removal second metatarsal Second MTP capsulotomy Partial condylectomy of second metatarsal head with exostectomy Extensor digitorum longus of the second toe tenolysis  SURGEON: Melony Overly, MD  ASSISTANT: None  ANESTHESIA: General LMA with peripheral nerve block  FINDINGS: Large bony prominence at the site of the retained orthopedic hardware with condylar exostosis Second toe extensor digitorum longus adhesions Fifth metatarsal base fracture  IMPLANTS: Arthrex, Jones screw  INDICATIONS:48 y.o. female sustained the above fracture within a twisting injury approximately 1 week ago.  She had continued pain and swelling.  She had had a similar fracture on the contralateral side that went on to nonunion requiring open treatment and repair for nonunion.  Given his history she requested surgical fixation of her fracture.  She was also complaining of pain at the second MTP joint with adhesions of her extensor digitorum longus and retained orthopedic hardware with condylar exostosis.  She asked for release of the joint and tenolysis procedure.  Patient understood the risk benefits alternatives surgery which include but are not limited to wound healing complications, infection, nonunion, malunion, need for further surgery and continued pain.  She also understood the postoperative risk and anesthetic risk which include death.  She was to proceed with surgery.  PROCEDURE:Patient was identified the preoperative holding area.  The right leg was marked myself.  Consent was  signed myself and the patient.  Peripheral nerve block was performed by anesthesia.  Patient was taken to the operative suite and placed supine on the operative table.  General anesthesia was induced out difficulty.  Preoperative antibiotics were given.  A bump was placed under the left hip. Bone foam was used.  All bony prominences well-padded.  Surgical timeout was performed.  The right lower extremity was prepped and draped in the usual sterile fashion.  We began by making a small longitudinal incision proximal to the fifth metatarsal base.  This taken sharply down through skin and subcutaneous tissue.  Then using a hemostat the soft tissue between the incision and the base of the fifth metatarsal was bluntly dissected and the tip of the fifth metatarsal was identified.  Fluoroscopy confirmed appropriate position.  Then the guidepin for the screw was placed at the base of the fifth metatarsal and using fluoroscopic images the pin was inserted into the medullary canal of the fifth metatarsal.  Then the appropriate length was determined.  Then the base of the fifth metatarsal cortex was entered using a drill over this guidepin.  Then a tap was used.  A partially-threaded solid screw was placed across the fracture site with good purchase.  Acceptable reduction was obtained and maintained.  The wound was irrigated and closed with a 3-0 nylon stitch.  We then turned our attention to the second toe.  There is a previous incision overlying the second MTP joint site of her hammertoe correction.  An incision longitudinally was created overlying this incision.  This taken sharply down through skin and subcutaneous tissue.  Blunt dissection was used to dissect the scar tissue overlying the extensor digitorum longus tendon and second MTP joint.  The tendon was encased in scar tissue and the scar tissue was dissected out and excised  with a 15 blade and with tenotomy scissors.  Then the capsular tissue was identified and  incised longitudinally.  The second MTP joint was opened and inspected.  There was no obvious cartilage wear within the joint.  There is a large condylar bony exostosis on the dorsal aspect of the second MTP which is impeding the proximal phalanx from moving.  This was removed with a rondure and smoothed.  We then dissected proximally along the second metatarsal and found the site of the screw entry which was encased in large amount of bone.  This was removed with a rondure and the screw was removed with a screwdriver uneventfully.  We then inspected the proximal aspect of the proximal phalanx and there is scar tissue here which was removed with a rondure and excised with a 15 blade.  After this the second MTP joint was able to further extend without bony or soft tissue impingement dorsally.  We then irrigated the wound and the capsular tissue was closed with a 3-0 Monocryl stitch.  The subcuticular tissue was closed with 3-0 Monocryl and the skin with a 3-0 nylon.  Soft dressing was placed.  She was placed in a short leg splint.  She was awakened from anesthesia and taken recovery in stable condition.  All counts were correct.  No complications.    POST OPERATIVE INSTRUCTIONS: Nonweightbearing to right lower extremity Call the office with concerns Keep splint dry She will follow-up in 2 weeks for nonweightbearing x-rays of the right foot At that point we will likely remove the sutures and she will go into a walking boot if appropriate.  TOURNIQUET TIME: No tourniquet was used  BLOOD LOSS:  Minimal         DRAINS: none         SPECIMEN: none       COMPLICATIONS:  * No complications entered in OR log *         Disposition: PACU - hemodynamically stable.         Condition: stable

## 2019-09-02 ENCOUNTER — Other Ambulatory Visit (HOSPITAL_COMMUNITY): Payer: 59

## 2019-09-03 ENCOUNTER — Encounter (HOSPITAL_COMMUNITY): Payer: Self-pay | Admitting: Radiology

## 2019-09-10 ENCOUNTER — Telehealth (HOSPITAL_COMMUNITY): Payer: Self-pay | Admitting: Radiology

## 2019-09-10 NOTE — Telephone Encounter (Signed)
Just an FYI. We have made several attempts to contact this patient including sending a letter to schedule or reschedule their echocardiogram. We will be removing the patient from the echo WQ.  09/03/19@10 :05 Lmom and mailed letter evd  12/28 patient cancelled echo  11/9 patient cancelled echo  10/19 patient cancelled echo  Thank you

## 2019-10-10 ENCOUNTER — Other Ambulatory Visit: Payer: Self-pay

## 2019-10-10 ENCOUNTER — Ambulatory Visit (HOSPITAL_COMMUNITY): Payer: Commercial Managed Care - PPO | Attending: Cardiology

## 2019-10-10 DIAGNOSIS — I351 Nonrheumatic aortic (valve) insufficiency: Secondary | ICD-10-CM | POA: Diagnosis present

## 2019-10-10 DIAGNOSIS — I350 Nonrheumatic aortic (valve) stenosis: Secondary | ICD-10-CM | POA: Diagnosis present

## 2020-09-27 ENCOUNTER — Emergency Department (HOSPITAL_BASED_OUTPATIENT_CLINIC_OR_DEPARTMENT_OTHER)
Admission: EM | Admit: 2020-09-27 | Discharge: 2020-09-27 | Disposition: A | Discharge status: Home / Self Care | Payer: Commercial Managed Care - PPO | Attending: Emergency Medicine | Admitting: Emergency Medicine

## 2020-09-27 ENCOUNTER — Encounter (HOSPITAL_BASED_OUTPATIENT_CLINIC_OR_DEPARTMENT_OTHER): Payer: Self-pay | Admitting: Emergency Medicine

## 2020-09-27 ENCOUNTER — Other Ambulatory Visit: Payer: Self-pay

## 2020-09-27 DIAGNOSIS — L0201 Cutaneous abscess of face: Secondary | ICD-10-CM | POA: Insufficient documentation

## 2020-09-27 DIAGNOSIS — W000XXA Fall on same level due to ice and snow, initial encounter: Secondary | ICD-10-CM | POA: Diagnosis not present

## 2020-09-27 DIAGNOSIS — E039 Hypothyroidism, unspecified: Secondary | ICD-10-CM | POA: Insufficient documentation

## 2020-09-27 DIAGNOSIS — S0031XA Abrasion of nose, initial encounter: Secondary | ICD-10-CM | POA: Insufficient documentation

## 2020-09-27 DIAGNOSIS — Z79899 Other long term (current) drug therapy: Secondary | ICD-10-CM | POA: Diagnosis not present

## 2020-09-27 DIAGNOSIS — M542 Cervicalgia: Secondary | ICD-10-CM | POA: Insufficient documentation

## 2020-09-27 DIAGNOSIS — M7918 Myalgia, other site: Secondary | ICD-10-CM

## 2020-09-27 DIAGNOSIS — M546 Pain in thoracic spine: Secondary | ICD-10-CM | POA: Diagnosis not present

## 2020-09-27 DIAGNOSIS — I10 Essential (primary) hypertension: Secondary | ICD-10-CM | POA: Insufficient documentation

## 2020-09-27 DIAGNOSIS — W009XXA Unspecified fall due to ice and snow, initial encounter: Secondary | ICD-10-CM

## 2020-09-27 MED ORDER — LIDOCAINE-EPINEPHRINE (PF) 2 %-1:200000 IJ SOLN
10.0000 mL | Freq: Once | INTRAMUSCULAR | Status: AC
  Administered 2020-09-27: 10 mL
  Filled 2020-09-27: qty 20

## 2020-09-27 MED ORDER — DOXYCYCLINE HYCLATE 100 MG PO CAPS: 100.0000 mg | ORAL_CAPSULE | Freq: Two times a day (BID) | ORAL | 0 refills | Status: AC

## 2020-09-27 MED ORDER — DOXYCYCLINE HYCLATE 100 MG PO TABS
100.0000 mg | ORAL_TABLET | Freq: Once | ORAL | Status: AC
  Administered 2020-09-27: 100 mg via ORAL
  Filled 2020-09-27: qty 1

## 2020-09-27 MED ORDER — HYDROCODONE-ACETAMINOPHEN 5-325 MG PO TABS: 1.0000 | ORAL_TABLET | Freq: Four times a day (QID) | ORAL | 0 refills | Status: AC | PRN

## 2020-09-27 NOTE — ED Triage Notes (Signed)
Pt arrives pov with c/o fall x 3 days pta. Pt reports neck pain and nose pain Pt also reports possible abscess to lower chin, endorses hx of MRSA. Pt denies fever.

## 2020-09-27 NOTE — ED Provider Notes (Signed)
[facility] EMERGENCY DEPARTMENT Provider Note   CSN: [id number] Arrival date & time: 09/27/20  1035     History Chief Complaint  Patient presents with  . Fall    Stacy Adkins is a 50 y.o. female.  Presents to the emergency department for evaluation of abscess under her chin which has been present for about the past 2 days.  She has a history of MRSA.  She feels like her lymph nodes are swollen.  No fevers, nausea or vomiting.  She tried to drain the area at home and expressed a small amount of clear liquid.  She has been on doxycycline and clindamycin in the past.  Patient also reports a fall from a slip on the ice 3 days ago.  Patient has some residual pain to the bridge of her nose.  She also has pain in her neck and upper back which feels "muscular" to her.  Her primary care doctor sent in a prescription for Flexeril which she has been taking without much relief.  She has been using heating pads.  No numbness, tingling, or weakness of the extremities reported.  Pain is worse with movement.        Past Medical History:  Diagnosis Date  . Anxiety   . Depression   . Displaced fracture of fifth metatarsal bone, right foot, initial encounter for open fracture   . GERD (gastroesophageal reflux disease)   . Hypertension   . Hypothyroidism   . Sleep apnea    lost 100lb with gastric sleeve, no longer uses CPAP  . Thyroid disease     Patient Active Problem List   Diagnosis Date Noted  . Murmur 06/25/2018  . Palpitations 06/25/2018    Past Surgical History:  Procedure Laterality Date  . ABDOMINAL HYSTERECTOMY    . FOOT SURGERY Bilateral   . HARDWARE REMOVAL Right 07/16/2019   Procedure: HARDWARE REMOVAL;  Surgeon: Terance Hart, MD;  Location: [facility];  Service: Orthopedics;  Laterality: Right;  . LAPAROSCOPIC GASTRIC SLEEVE RESECTION    . REPAIR EXTENSOR TENDON WITH METATARSAL OSTEOTOMY AND OPEN REDUCTION IN Right 07/16/2019   Procedure:  REPAIR EXTENSOR TENDON WITH METATARSAL OSTEOTOMY AND OPEN REDUCTION INTERNAL FIXATION (ORIF) METATARSAL, 2ND MTP CAPSULOTOMY AND DEBRIDEMENT, PARTIAL RESECTION OF PHALANX;  Surgeon: Terance Hart, MD;  Location: [facility];  Service: Orthopedics;  Laterality: Right;  . SINUS IRRIGATION       OB History   No obstetric history on file.     Family History  Problem Relation Age of Onset  . Congestive Heart Failure Mother   . Atrial fibrillation Mother   . ALS Father   . Congestive Heart Failure Maternal Grandmother     Social History   Tobacco Use  . Smoking status: Never Smoker  . Smokeless tobacco: Never Used  Substance Use Topics  . Alcohol use: Yes    Comment: social  . Drug use: Never    Home Medications Prior to Admission medications   Medication Sig Start Date End Date Taking? Authorizing Provider  clonazePAM (KLONOPIN) 0.5 MG tablet Take 0.5 mg by mouth 2 (two) times daily as needed for anxiety.    [provider]  levothyroxine (SYNTHROID) 50 MCG tablet Take 50 mcg by mouth daily before breakfast.    [provider]  pantoprazole (PROTONIX) 40 MG tablet Take 40 mg by mouth daily.    [provider]  spironolactone (ALDACTONE) 25 MG tablet Take 25 mg by  mouth daily.    [provider]  traMADol (ULTRAM) 50 MG tablet Take by mouth every 6 (six) hours as needed.    [provider]  venlafaxine (EFFEXOR) 100 MG tablet Take 225 mg by mouth 2 (two) times daily.    [provider]    Allergies    Sulfa antibiotics  Review of Systems   Review of Systems  Constitutional: Negative for fever.  HENT: Positive for facial swelling.   Gastrointestinal: Negative for nausea and vomiting.  Musculoskeletal: Positive for back pain and neck pain.  Skin: Negative for color change.       Positive for abscess.  Hematological: Negative for adenopathy.    Physical Exam Updated Vital Signs BP (!) 141/97 (BP  Location: Right Arm)   Pulse 68   Temp 98.7 F (37.1 C) (Oral)   Resp 18   Ht 5\' 5"  (1.651 m)   Wt 99.8 kg   SpO2 100%   BMI 36.61 kg/m   Physical Exam Vitals and nursing note reviewed.  Constitutional:      Appearance: She is well-developed and well-nourished.  HENT:     Head: Normocephalic. No raccoon eyes or Battle's sign.     Comments: Patient with a slight abrasion without significant swelling or ecchymosis over the bridge of the nose.  No deformities.  Nares are patent.    Right Ear: Tympanic membrane, ear canal and external ear normal. No hemotympanum.     Left Ear: Tympanic membrane, ear canal and external ear normal. No hemotympanum.     Nose: Nose normal. No nasal septal hematoma.     Mouth/Throat:     Mouth: Oropharynx is clear and moist and mucous membranes are normal.     Pharynx: Uvula midline.     Comments: Full range of motion of the jaw without malocclusion.  No dental injury reported or visualized. Eyes:     General: Lids are normal.     Extraocular Movements: EOM normal.     Right eye: No nystagmus.     Left eye: No nystagmus.     Conjunctiva/sclera: Conjunctivae normal.     Pupils: Pupils are equal, round, and reactive to light.     Comments: No visible hyphema noted  Cardiovascular:     Rate and Rhythm: Normal rate and regular rhythm.  Pulmonary:     Effort: Pulmonary effort is normal.     Breath sounds: Normal breath sounds.  Abdominal:     Palpations: Abdomen is soft.     Tenderness: There is no abdominal tenderness.  Musculoskeletal:     Cervical back: Normal range of motion and neck supple. Tenderness and bony tenderness present. Normal range of motion.     Thoracic back: Tenderness present. No bony tenderness.     Lumbar back: No tenderness or bony tenderness.  Lymphadenopathy:     Comments: Mild anterior cervical lymphadenopathy.  Skin:    General: Skin is warm and dry.     Comments: Patient has a 1-2 cm area of induration with mild  surrounding erythema on the underside of the chin consistent with abscess.  No active drainage.  Area is very tender to touch.  Neurological:     Mental Status: She is alert and oriented to person, place, and time.     GCS: GCS eye subscore is 4. GCS verbal subscore is 5. GCS motor subscore is 6.     Cranial Nerves: No cranial nerve deficit.     Sensory: No sensory  deficit.     Coordination: Coordination normal.     Deep Tendon Reflexes: Strength normal and reflexes are normal and symmetric.  Psychiatric:        Mood and Affect: Mood and affect normal.     ED Results / Procedures / Treatments   Labs (all labs ordered are listed, but only abnormal results are displayed) Labs Reviewed - No data to display  EKG None  Radiology No results found.  Procedures .[name].Incision and Drainage  Date/Time: 09/27/2020 1:33 PM Performed by: Renne CriglerGeiple, Heavenlee Maiorana, PA-C Authorized by: Renne CriglerGeiple, Reginaldo Hazard, PA-C   Consent:    Consent obtained:  Verbal   Consent given by:  Patient   Risks discussed:  Bleeding, incomplete drainage, pain and infection   Alternatives discussed:  No treatment Universal protocol:    Patient identity confirmed:  Verbally with patient and arm band Location:    Type:  Abscess   Size:  1cm   Location:  Head   Head location:  Face (under chin) Pre-procedure details:    Skin preparation:  Povidone-iodine Sedation:    Sedation type:  None Anesthesia:    Anesthesia method:  Local infiltration   Local anesthetic:  Lidocaine 2% WITH epi Procedure type:    Complexity:  Simple Procedure details:    Incision types:  Stab incision   Wound management:  Probed and deloculated   Drainage:  Purulent and bloody   Drainage amount:  Scant   Wound treatment: one 5-0 vicryl rapide suture placed. Post-procedure details:    Procedure completion:  Tolerated Comments:     Suture placed due to mild residual bleeding and location, to reduce scarring.    (including critical care  time)  Medications Ordered in ED Medications  lidocaine-EPINEPHrine (XYLOCAINE W/EPI) 2 %-1:200000 (PF) injection 10 mL (has no administration in time range)  doxycycline (VIBRA-TABS) tablet 100 mg (has no administration in time range)    ED Course  I have reviewed the triage vital signs and the nursing notes.  Pertinent labs & imaging results that were available during my care of the patient were reviewed by me and considered in my medical decision making (see chart for details).  Patient seen and examined.   Regards to injury from the fall, patient seems to have residual muscular pain.  This is her secondary complaint today.  We discussed imaging of the cervical spine which would probably be low yield.  She has reasonable range of motion of her neck without point tenderness over the bones.  She would like to defer imaging at this time.  We discussed treatment of abscess with antibiotics and incision and drainage.  Patient would like to proceed with I&D today.  Vital signs reviewed and are as follows: BP (!) 141/97 (BP Location: Right Arm)   Pulse 68   Temp 98.7 F (37.1 C) (Oral)   Resp 18   Ht 5\' 5"  (1.651 m)   Wt 99.8 kg   SpO2 100%   BMI 36.61 kg/m   1:34 PM The patient was urged to return to the Emergency Department urgently with worsening pain, swelling, expanding erythema especially if it streaks away from the affected area, fever, or if they have any other concerns.   Encourage PCP or recheck in the ED in 48 to 72 hours if not improving.  Discussed continued use of ice, heat, Flexeril, NSAIDs for muscle pain related to her fall.  Patient counseled on use of narcotic pain medications for more severe discomfort. Counseled not to combine  these medications with others containing tylenol. Urged not to drink alcohol, drive, or perform any other activities that requires focus while taking these medications. The patient verbalizes understanding and agrees with the plan.   The  patient verbalized understanding and stated agreement with this plan.       MDM Rules/Calculators/A&P                          Abscess: I&D attempted, small amount of pus returned, wound closed due to location. Patient started on doxycycline.  Fall: Doubt significant bony injury. Discussed x-rays with patient at this point she declines. I have low concern for fracture. She will continue supportive care for musculoskeletal pain.   Final Clinical Impression(s) / ED Diagnoses Final diagnoses:  Abscess of chin  Musculoskeletal pain  Fall due to slipping on ice or snow, initial encounter    Rx / DC Orders ED Discharge Orders         Ordered    doxycycline (VIBRAMYCIN) 100 MG capsule  2 times daily        09/27/20 1332    HYDROcodone-acetaminophen (NORCO/VICODIN) 5-325 MG tablet  Every 6 hours PRN        09/27/20 1332           Renne Crigler, PA-C 09/27/20 1336    Alvira Monday, MD 09/28/20 1152

## 2020-09-27 NOTE — Discharge Instructions (Signed)
Please read and follow all provided instructions.  Your diagnoses today include:  1. Abscess of chin   2. Musculoskeletal pain   3. Fall due to slipping on ice or snow, initial encounter     Tests performed today include:  Vital signs. See below for your results today.   Medications prescribed:   Doxycycline - antibiotic  You have been prescribed an antibiotic medicine: take the entire course of medicine even if you are feeling better. Stopping early can cause the antibiotic not to work.   Vicodin (hydrocodone/acetaminophen) - narcotic pain medication  DO NOT drive or perform any activities that require you to be awake and alert because this medicine can make you drowsy. BE VERY CAREFUL not to take multiple medicines containing Tylenol (also called acetaminophen). Doing so can lead to an overdose which can damage your liver and cause liver failure and possibly death.  Take any prescribed medications only as directed.   Home care instructions:   Follow any educational materials contained in this packet  Follow-up instructions: Return to the Emergency Department or see your doctor in 48-72 hours for a recheck if your symptoms are not significantly improved.  Return instructions:  Return to the Emergency Department if you have:  Fever  Worsening pain in your neck, swelling of the neck, significant difficulty breathing or swallowing, or significant pain opening up your mouth  Worsening symptoms  Worsening pain  Worsening swelling  Redness of the skin that moves away from the affected area, especially if it streaks away from the affected area   Any other emergent concerns   Your vital signs today were: BP (!) 141/97 (BP Location: Right Arm)    Pulse 68    Temp 98.7 F (37.1 C) (Oral)    Resp 18    Ht 5\' 5"  (1.651 m)    Wt 99.8 kg    SpO2 100%    BMI 36.61 kg/m  If your blood pressure (BP) was elevated above 135/85 this visit, please have this repeated by your doctor  within one month. --------------

## 2020-10-08 ENCOUNTER — Emergency Department (HOSPITAL_BASED_OUTPATIENT_CLINIC_OR_DEPARTMENT_OTHER): Payer: Commercial Managed Care - PPO

## 2020-10-08 ENCOUNTER — Emergency Department (HOSPITAL_BASED_OUTPATIENT_CLINIC_OR_DEPARTMENT_OTHER)
Admission: EM | Admit: 2020-10-08 | Discharge: 2020-10-08 | Disposition: A | Discharge status: Home / Self Care | Payer: Commercial Managed Care - PPO | Attending: Emergency Medicine | Admitting: Emergency Medicine

## 2020-10-08 ENCOUNTER — Encounter (HOSPITAL_BASED_OUTPATIENT_CLINIC_OR_DEPARTMENT_OTHER): Payer: Self-pay | Admitting: *Deleted

## 2020-10-08 ENCOUNTER — Other Ambulatory Visit: Payer: Self-pay

## 2020-10-08 DIAGNOSIS — M542 Cervicalgia: Secondary | ICD-10-CM | POA: Diagnosis not present

## 2020-10-08 DIAGNOSIS — W01198A Fall on same level from slipping, tripping and stumbling with subsequent striking against other object, initial encounter: Secondary | ICD-10-CM | POA: Insufficient documentation

## 2020-10-08 DIAGNOSIS — I10 Essential (primary) hypertension: Secondary | ICD-10-CM | POA: Insufficient documentation

## 2020-10-08 DIAGNOSIS — Y9301 Activity, walking, marching and hiking: Secondary | ICD-10-CM | POA: Insufficient documentation

## 2020-10-08 DIAGNOSIS — S0083XA Contusion of other part of head, initial encounter: Secondary | ICD-10-CM | POA: Diagnosis not present

## 2020-10-08 DIAGNOSIS — M25531 Pain in right wrist: Secondary | ICD-10-CM | POA: Diagnosis not present

## 2020-10-08 DIAGNOSIS — R5383 Other fatigue: Secondary | ICD-10-CM | POA: Diagnosis not present

## 2020-10-08 DIAGNOSIS — S61213A Laceration without foreign body of left middle finger without damage to nail, initial encounter: Secondary | ICD-10-CM | POA: Insufficient documentation

## 2020-10-08 DIAGNOSIS — Y92009 Unspecified place in unspecified non-institutional (private) residence as the place of occurrence of the external cause: Secondary | ICD-10-CM | POA: Insufficient documentation

## 2020-10-08 DIAGNOSIS — S060X0A Concussion without loss of consciousness, initial encounter: Secondary | ICD-10-CM | POA: Diagnosis not present

## 2020-10-08 DIAGNOSIS — S0993XA Unspecified injury of face, initial encounter: Secondary | ICD-10-CM | POA: Diagnosis present

## 2020-10-08 DIAGNOSIS — Z79899 Other long term (current) drug therapy: Secondary | ICD-10-CM | POA: Insufficient documentation

## 2020-10-08 DIAGNOSIS — W19XXXA Unspecified fall, initial encounter: Secondary | ICD-10-CM

## 2020-10-08 DIAGNOSIS — E039 Hypothyroidism, unspecified: Secondary | ICD-10-CM | POA: Diagnosis not present

## 2020-10-08 NOTE — Discharge Instructions (Signed)
The CAT scans today were normal without any sign of facial fracture or internal injury of the brain.  You most likely have a concussion from hitting your head yesterday which is causing the headache and the drowsiness.  You can take Tylenol and ibuprofen for the pain.  Ice may help with the swelling on your face.  Make sure you are drinking plenty of fluids and getting lots of rest as well as avoiding eyestrain

## 2020-10-08 NOTE — ED Triage Notes (Signed)
States she tripped and fell yesterday. Injury to the right side of her face. Lethargy and headache. No LOC. No blood thinners. She is ambulatory. Alert and oriented.

## 2020-10-08 NOTE — ED Provider Notes (Signed)
[facility] EMERGENCY DEPARTMENT Provider Note   CSN: [id number] Arrival date & time: 10/08/20  1213     History Chief Complaint  Patient presents with  . Fall    Stacy Adkins is a 50 y.o. female.  The history is provided by the patient.  Fall This is a new problem. The current episode started yesterday. The problem occurs constantly. The problem has not changed since onset.Associated symptoms comments: Patient reports that she was walking into her house yesterday when she tripped over a lip in the sidewalk and fell face first onto the concrete.  She hit the right side of her face on the concrete as well as her right arm.  She had no loss of consciousness but her son had to help her get up due to the severe pain.  Since that time she has been taking medications but noticed today she was extremely fatigued and having a lot of pain in the right side of her face.  She denies any loose teeth or her teeth not lining upright.  She is also having neck pain but denies any numbness or tingling in her upper or lower extremities.  She has been able to ambulate with minimal difficulty today.  She denies any visual changes.  She does report the headache is getting worse and its in the right side of her head but does not radiate.  Some nausea but no vomiting. Nothing aggravates the symptoms. Nothing relieves the symptoms. The treatment provided no relief.       Past Medical History:  Diagnosis Date  . Anxiety   . Depression   . Displaced fracture of fifth metatarsal bone, right foot, initial encounter for open fracture   . GERD (gastroesophageal reflux disease)   . Hypertension   . Hypothyroidism   . Sleep apnea    lost 100lb with gastric sleeve, no longer uses CPAP  . Thyroid disease     Patient Active Problem List   Diagnosis Date Noted  . Murmur 06/25/2018  . Palpitations 06/25/2018    Past Surgical History:  Procedure Laterality Date  . ABDOMINAL HYSTERECTOMY    . FOOT  SURGERY Bilateral   . HARDWARE REMOVAL Right 07/16/2019   Procedure: HARDWARE REMOVAL;  Surgeon: Terance Hart, MD;  Location: [facility];  Service: Orthopedics;  Laterality: Right;  . LAPAROSCOPIC GASTRIC SLEEVE RESECTION    . REPAIR EXTENSOR TENDON WITH METATARSAL OSTEOTOMY AND OPEN REDUCTION IN Right 07/16/2019   Procedure: REPAIR EXTENSOR TENDON WITH METATARSAL OSTEOTOMY AND OPEN REDUCTION INTERNAL FIXATION (ORIF) METATARSAL, 2ND MTP CAPSULOTOMY AND DEBRIDEMENT, PARTIAL RESECTION OF PHALANX;  Surgeon: Terance Hart, MD;  Location: [facility];  Service: Orthopedics;  Laterality: Right;  . SINUS IRRIGATION       OB History   No obstetric history on file.     Family History  Problem Relation Age of Onset  . Congestive Heart Failure Mother   . Atrial fibrillation Mother   . ALS Father   . Congestive Heart Failure Maternal Grandmother     Social History   Tobacco Use  . Smoking status: Never Smoker  . Smokeless tobacco: Never Used  Substance Use Topics  . Alcohol use: Yes    Comment: social  . Drug use: Never    Home Medications Prior to Admission medications   Medication Sig Start Date End Date Taking? Authorizing Provider  clonazePAM (KLONOPIN) 0.5 MG tablet Take 0.5 mg by mouth 2 (two) times daily as  needed for anxiety.   Yes [provider]  doxycycline (VIBRAMYCIN) 100 MG capsule Take 1 capsule (100 mg total) by mouth 2 (two) times daily. 09/27/20  Yes Renne Crigler, PA-C  levothyroxine (SYNTHROID) 50 MCG tablet Take 50 mcg by mouth daily before breakfast.   Yes [provider]  pantoprazole (PROTONIX) 40 MG tablet Take 40 mg by mouth daily.   Yes [provider]  spironolactone (ALDACTONE) 25 MG tablet Take 25 mg by mouth daily.   Yes [provider]  venlafaxine (EFFEXOR) 100 MG tablet Take 225 mg by mouth 2 (two) times daily.   Yes [provider]  HYDROcodone-acetaminophen  (NORCO/VICODIN) 5-325 MG tablet Take 1 tablet by mouth every 6 (six) hours as needed for severe pain. 09/27/20   Renne Crigler, PA-C  traMADol (ULTRAM) 50 MG tablet Take by mouth every 6 (six) hours as needed.    [provider]    Allergies    Sulfa antibiotics  Review of Systems   Review of Systems  All other systems reviewed and are negative.   Physical Exam Updated Vital Signs BP (!) 130/91   Pulse 72   Temp 98.1 F (36.7 C) (Oral)   Resp 20   Ht 5\' 5"  (1.651 m)   Wt 99.6 kg   SpO2 100%   BMI 36.54 kg/m   Physical Exam Vitals and nursing note reviewed.  Constitutional:      General: She is not in acute distress.    Appearance: Normal appearance. She is well-developed and well-nourished.  HENT:     Head: Normocephalic.   Eyes:     Extraocular Movements: EOM normal.     Pupils: Pupils are equal, round, and reactive to light.  Neck:   Cardiovascular:     Rate and Rhythm: Normal rate.     Pulses: Intact distal pulses.  Pulmonary:     Effort: Pulmonary effort is normal.  Musculoskeletal:        General: Normal range of motion.     Cervical back: Normal range of motion and neck supple. Tenderness present. Spinous process tenderness and muscular tenderness present. Normal range of motion.     Comments: No edema.  1 cm laceration over the left middle finger with no nail involvement.  Mild tenderness with flexion extension of the right wrist.  2+ right radial pulse normal hand grip.  No snuffbox tenderness.  Skin:    General: Skin is warm and dry.     Findings: No rash.  Neurological:     General: No focal deficit present.     Mental Status: She is alert and oriented to person, place, and time. Mental status is at baseline.     Cranial Nerves: No cranial nerve deficit.     Motor: No weakness.     Gait: Gait normal.  Psychiatric:        Mood and Affect: Mood and affect and mood normal.        Behavior: Behavior normal.     ED Results / Procedures /  Treatments   Labs (all labs ordered are listed, but only abnormal results are displayed) Labs Reviewed - No data to display  EKG None  Radiology CT Head Wo Contrast  Result Date: 10/08/2020 CLINICAL DATA:  Tripped and fell yesterday. Injured right side of her face. Headache. EXAM: CT HEAD WITHOUT CONTRAST CT MAXILLOFACIAL WITHOUT CONTRAST CT CERVICAL SPINE WITHOUT CONTRAST TECHNIQUE: Multidetector CT imaging of the head, cervical spine, and maxillofacial structures were  performed using the standard protocol without intravenous contrast. Multiplanar CT image reconstructions of the cervical spine and maxillofacial structures were also generated. COMPARISON:  CT head report dated August 17, 2009. FINDINGS: CT HEAD FINDINGS Brain: No evidence of acute infarction, hemorrhage, hydrocephalus, extra-axial collection or mass lesion/mass effect. Empty sella. Vascular: No hyperdense vessel or unexpected calcification. Skull: Normal. Negative for fracture or focal lesion. Other: None. CT MAXILLOFACIAL FINDINGS Osseous: No fracture or mandibular dislocation. No destructive process. Orbits: Negative. No traumatic or inflammatory finding. Sinuses: Small air-fluid level in the left sphenoid sinus. Frothy secretions in the right sphenoid sinus. Retention cyst in the left maxillary sinus. Soft tissues: Negative. CT CERVICAL SPINE FINDINGS Alignment: No traumatic malalignment. Skull base and vertebrae: No acute fracture. No primary bone lesion or focal pathologic process. Soft tissues and spinal canal: No prevertebral fluid or swelling. No visible canal hematoma. Disc levels: Mild to moderate disc height loss and uncovertebral hypertrophy from C4-C5 through C6-C7. Upper chest: Negative. Other: None. IMPRESSION: 1. No acute intracranial abnormality. 2. No acute maxillofacial fracture. 3. No acute cervical spine fracture or traumatic listhesis. Electronically Signed   By: Obie Dredge M.D.   On: 10/08/2020 13:23   CT  Cervical Spine Wo Contrast  Result Date: 10/08/2020 CLINICAL DATA:  Tripped and fell yesterday. Injured right side of her face. Headache. EXAM: CT HEAD WITHOUT CONTRAST CT MAXILLOFACIAL WITHOUT CONTRAST CT CERVICAL SPINE WITHOUT CONTRAST TECHNIQUE: Multidetector CT imaging of the head, cervical spine, and maxillofacial structures were performed using the standard protocol without intravenous contrast. Multiplanar CT image reconstructions of the cervical spine and maxillofacial structures were also generated. COMPARISON:  CT head report dated August 17, 2009. FINDINGS: CT HEAD FINDINGS Brain: No evidence of acute infarction, hemorrhage, hydrocephalus, extra-axial collection or mass lesion/mass effect. Empty sella. Vascular: No hyperdense vessel or unexpected calcification. Skull: Normal. Negative for fracture or focal lesion. Other: None. CT MAXILLOFACIAL FINDINGS Osseous: No fracture or mandibular dislocation. No destructive process. Orbits: Negative. No traumatic or inflammatory finding. Sinuses: Small air-fluid level in the left sphenoid sinus. Frothy secretions in the right sphenoid sinus. Retention cyst in the left maxillary sinus. Soft tissues: Negative. CT CERVICAL SPINE FINDINGS Alignment: No traumatic malalignment. Skull base and vertebrae: No acute fracture. No primary bone lesion or focal pathologic process. Soft tissues and spinal canal: No prevertebral fluid or swelling. No visible canal hematoma. Disc levels: Mild to moderate disc height loss and uncovertebral hypertrophy from C4-C5 through C6-C7. Upper chest: Negative. Other: None. IMPRESSION: 1. No acute intracranial abnormality. 2. No acute maxillofacial fracture. 3. No acute cervical spine fracture or traumatic listhesis. Electronically Signed   By: Obie Dredge M.D.   On: 10/08/2020 13:23   CT Maxillofacial Wo Contrast  Result Date: 10/08/2020 CLINICAL DATA:  Tripped and fell yesterday. Injured right side of her face. Headache. EXAM: CT  HEAD WITHOUT CONTRAST CT MAXILLOFACIAL WITHOUT CONTRAST CT CERVICAL SPINE WITHOUT CONTRAST TECHNIQUE: Multidetector CT imaging of the head, cervical spine, and maxillofacial structures were performed using the standard protocol without intravenous contrast. Multiplanar CT image reconstructions of the cervical spine and maxillofacial structures were also generated. COMPARISON:  CT head report dated August 17, 2009. FINDINGS: CT HEAD FINDINGS Brain: No evidence of acute infarction, hemorrhage, hydrocephalus, extra-axial collection or mass lesion/mass effect. Empty sella. Vascular: No hyperdense vessel or unexpected calcification. Skull: Normal. Negative for fracture or focal lesion. Other: None. CT MAXILLOFACIAL FINDINGS Osseous: No fracture or mandibular dislocation. No destructive process. Orbits: Negative. No traumatic or inflammatory finding.  Sinuses: Small air-fluid level in the left sphenoid sinus. Frothy secretions in the right sphenoid sinus. Retention cyst in the left maxillary sinus. Soft tissues: Negative. CT CERVICAL SPINE FINDINGS Alignment: No traumatic malalignment. Skull base and vertebrae: No acute fracture. No primary bone lesion or focal pathologic process. Soft tissues and spinal canal: No prevertebral fluid or swelling. No visible canal hematoma. Disc levels: Mild to moderate disc height loss and uncovertebral hypertrophy from C4-C5 through C6-C7. Upper chest: Negative. Other: None. IMPRESSION: 1. No acute intracranial abnormality. 2. No acute maxillofacial fracture. 3. No acute cervical spine fracture or traumatic listhesis. Electronically Signed   By: Obie Dredge M.D.   On: 10/08/2020 13:23    Procedures Procedures   Medications Ordered in ED Medications - No data to display  ED Course  I have reviewed the triage vital signs and the nursing notes.  Pertinent labs & imaging results that were available during my care of the patient were reviewed by me and considered in my medical  decision making (see chart for details).    MDM Rules/Calculators/A&P                          Patient presenting after a fall yesterday where she hit face first on the concrete.  She does have evidence of trauma to the right side of her face and is also complaining of headache, excessive fatigue and neck pain.  Patient is neurovascularly intact at this time however concern for concussion or small bleed given the trauma.  Also concern for facial fractures.  Normal strength in the upper and lower extremities and normal gait.  Patient does have a small laceration to her left middle finger but laceration has been present now greater then 12 hours and will have to heal by secondary intention.  CTs are negative for acute fracture or intracranial bleed.  Patient given precautions for concussion and supportive care.  MDM Number of Diagnoses or Management Options   Amount and/or Complexity of Data Reviewed Tests in the radiology section of CPT: ordered and reviewed Independent visualization of images, tracings, or specimens: yes  Risk of Complications, Morbidity, and/or Mortality Presenting problems: moderate Diagnostic procedures: low Management options: low  Patient Progress Patient progress: stable   Final Clinical Impression(s) / ED Diagnoses Final diagnoses:  Fall, initial encounter  Contusion of face, initial encounter  Concussion without loss of consciousness, initial encounter    Rx / DC Orders ED Discharge Orders    None       Gwyneth Sprout, MD 10/08/20 1359

## 2020-10-10 ENCOUNTER — Other Ambulatory Visit: Payer: Self-pay

## 2020-10-10 ENCOUNTER — Encounter (HOSPITAL_COMMUNITY): Payer: Self-pay

## 2020-10-10 DIAGNOSIS — E039 Hypothyroidism, unspecified: Secondary | ICD-10-CM | POA: Diagnosis not present

## 2020-10-10 DIAGNOSIS — Z79899 Other long term (current) drug therapy: Secondary | ICD-10-CM | POA: Diagnosis not present

## 2020-10-10 DIAGNOSIS — I10 Essential (primary) hypertension: Secondary | ICD-10-CM | POA: Diagnosis not present

## 2020-10-10 DIAGNOSIS — T424X1A Poisoning by benzodiazepines, accidental (unintentional), initial encounter: Secondary | ICD-10-CM | POA: Diagnosis present

## 2020-10-10 NOTE — ED Triage Notes (Addendum)
Pt reports ingesting approximately 4-12 0.5mg  Klonopin (2mg -6mg  total) at 2130. Alcohol intoxication. Denies Si/ Hi. Reports ingestion was to help sleep after argument with husband. 6 hour observation from ingestion.

## 2020-10-10 NOTE — ED Notes (Addendum)
Poison Control contacted. Spoke with Duwayne Heck, RN  Recommendations: monitor bp. Supplemental oxygen if needed. Does not recommend flumazenil.

## 2020-10-11 ENCOUNTER — Emergency Department (HOSPITAL_COMMUNITY)
Admission: EM | Admit: 2020-10-11 | Discharge: 2020-10-11 | Disposition: A | Discharge status: Home / Self Care | Payer: Commercial Managed Care - PPO | Attending: Emergency Medicine | Admitting: Emergency Medicine

## 2020-10-11 DIAGNOSIS — T424X1A Poisoning by benzodiazepines, accidental (unintentional), initial encounter: Secondary | ICD-10-CM

## 2020-10-11 MED ORDER — ACYCLOVIR 200 MG PO CAPS
400.0000 mg | ORAL_CAPSULE | Freq: Once | ORAL | Status: AC
  Administered 2020-10-11: 400 mg via ORAL
  Filled 2020-10-11: qty 2

## 2020-10-11 MED ORDER — ACYCLOVIR 400 MG PO TABS: 400.0000 mg | ORAL_TABLET | Freq: Four times a day (QID) | ORAL | 0 refills | Status: AC

## 2020-10-11 NOTE — ED Notes (Signed)
Pt given water to drink. 

## 2020-10-11 NOTE — Discharge Instructions (Addendum)
Thank you for allowing me to care for you today in the Emergency Department.   Take acyclovir 4 times daily for the next week for your cold sore.  You can follow-up with [facility] if you have any concerns about depression or anxiety.  If you feel unsafe at home, you can always return to the emergency department.  Return the emergency department if you have any thoughts of wanting to harm yourself, difficulty breathing, if you take too much of your home medication, or have other new, concerning symptoms.

## 2020-10-11 NOTE — ED Provider Notes (Signed)
[facility]-EMERGENCY DEPT Provider Note   CSN: [id number] Arrival date & time: 10/10/20  2328     History Chief Complaint  Patient presents with  . Drug Overdose    Stacy Adkins is a 50 y.o. female with a history of hypothyroidism, HTN, GERD, anxiety, and depression who presents to the emergency department by EMS with a chief complaint of overdose.  EMS reported that the patient ingested approximately 4-12 0.5 mg clonazepam tablets (2-6 mg total) at approximately 2130.  She endorsed minimal alcohol use earlier in the day.  On my evaluation, the patient states that she thought she only took 4 tablets of her home medication.  However, EMS reported that 12 tablets were missing from the bottle.  The patient is adamant that she only wanted to go to sleep as she had had an argument with her husband earlier in the evening.  Patient adamantly denies SI, HI, or auditory visual hallucinations.  Reports that she does feel safe at home.  Denies any concerns for partner violence.  She denies chest pain, shortness of breath, abdominal pain, nausea, vomiting, diarrhea, headache, confusion, visual changes, numbness, or weakness.  She does report that she is developing a cold sore on her upper lip.  The history is provided by medical records and the patient. No language interpreter was used.       Past Medical History:  Diagnosis Date  . Anxiety   . Depression   . Displaced fracture of fifth metatarsal bone, right foot, initial encounter for open fracture   . GERD (gastroesophageal reflux disease)   . Hypertension   . Hypothyroidism   . Sleep apnea    lost 100lb with gastric sleeve, no longer uses CPAP  . Thyroid disease     Patient Active Problem List   Diagnosis Date Noted  . Murmur 06/25/2018  . Palpitations 06/25/2018    Past Surgical History:  Procedure Laterality Date  . ABDOMINAL HYSTERECTOMY    . FOOT SURGERY Bilateral   . HARDWARE REMOVAL Right 07/16/2019    Procedure: HARDWARE REMOVAL;  Surgeon: Terance Hart, MD;  Location: [facility];  Service: Orthopedics;  Laterality: Right;  . LAPAROSCOPIC GASTRIC SLEEVE RESECTION    . REPAIR EXTENSOR TENDON WITH METATARSAL OSTEOTOMY AND OPEN REDUCTION IN Right 07/16/2019   Procedure: REPAIR EXTENSOR TENDON WITH METATARSAL OSTEOTOMY AND OPEN REDUCTION INTERNAL FIXATION (ORIF) METATARSAL, 2ND MTP CAPSULOTOMY AND DEBRIDEMENT, PARTIAL RESECTION OF PHALANX;  Surgeon: Terance Hart, MD;  Location:  SURGERY CENTER;  Service: Orthopedics;  Laterality: Right;  . SINUS IRRIGATION       OB History   No obstetric history on file.     Family History  Problem Relation Age of Onset  . Congestive Heart Failure Mother   . Atrial fibrillation Mother   . ALS Father   . Congestive Heart Failure Maternal Grandmother     Social History   Tobacco Use  . Smoking status: Never Smoker  . Smokeless tobacco: Never Used  Substance Use Topics  . Alcohol use: Yes    Comment: social  . Drug use: Never    Home Medications Prior to Admission medications   Medication Sig Start Date End Date Taking? Authorizing Provider  acyclovir (ZOVIRAX) 400 MG tablet Take 1 tablet (400 mg total) by mouth 4 (four) times daily for 7 days. 10/11/20 10/18/20 Yes Annalena Piatt A, PA-C  clonazePAM (KLONOPIN) 0.5 MG tablet Take 0.5 mg by mouth 2 (two) times daily as  needed for anxiety.    [provider]  doxycycline (VIBRAMYCIN) 100 MG capsule Take 1 capsule (100 mg total) by mouth 2 (two) times daily. 09/27/20   Renne Crigler, PA-C  HYDROcodone-acetaminophen (NORCO/VICODIN) 5-325 MG tablet Take 1 tablet by mouth every 6 (six) hours as needed for severe pain. 09/27/20   Renne Crigler, PA-C  levothyroxine (SYNTHROID) 50 MCG tablet Take 50 mcg by mouth daily before breakfast.    [provider]  pantoprazole (PROTONIX) 40 MG tablet Take 40 mg by mouth daily.    [provider]   spironolactone (ALDACTONE) 25 MG tablet Take 25 mg by mouth daily.    [provider]  traMADol (ULTRAM) 50 MG tablet Take by mouth every 6 (six) hours as needed.    [provider]  venlafaxine (EFFEXOR) 100 MG tablet Take 225 mg by mouth 2 (two) times daily.    [provider]    Allergies    Sulfa antibiotics  Review of Systems   Review of Systems  Constitutional: Negative for activity change, chills and fever.  HENT: Negative for congestion and sore throat.   Respiratory: Negative for shortness of breath.   Cardiovascular: Negative for chest pain.  Gastrointestinal: Negative for abdominal pain, diarrhea, nausea and vomiting.  Genitourinary: Negative for dysuria.  Musculoskeletal: Negative for back pain, joint swelling, myalgias and neck stiffness.  Skin: Positive for rash. Negative for wound.  Allergic/Immunologic: Negative for immunocompromised state.  Neurological: Negative for dizziness, seizures, syncope, weakness, numbness and headaches.  Psychiatric/Behavioral: Negative for confusion.    Physical Exam Updated Vital Signs BP (!) 138/93   Pulse 78   Temp 97.9 F (36.6 C) (Oral)   Resp (!) 22   SpO2 98%   Physical Exam Vitals and nursing note reviewed.  Constitutional:      General: She is not in acute distress.    Appearance: She is not ill-appearing, toxic-appearing or diaphoretic.  HENT:     Head: Normocephalic.     Comments: 5 mm nodular lesion noted to the submental region.  No sublingual edema.  Tolerating secretions without difficulty.    Nose: Nose normal.     Mouth/Throat:     Pharynx: No oropharyngeal exudate or posterior oropharyngeal erythema.     Comments: Single, erythematous, developing vesicular lesion noted to the upper lip. Eyes:     Conjunctiva/sclera: Conjunctivae normal.  Cardiovascular:     Rate and Rhythm: Normal rate and regular rhythm.     Heart sounds: No murmur heard. No friction rub. No gallop.    Pulmonary:     Effort: Pulmonary effort is normal. No respiratory distress.     Breath sounds: No stridor. No wheezing, rhonchi or rales.  Chest:     Chest wall: No tenderness.  Abdominal:     General: There is no distension.     Palpations: Abdomen is soft. There is no mass.     Tenderness: There is no abdominal tenderness. There is no right CVA tenderness, left CVA tenderness, guarding or rebound.     Hernia: No hernia is present.  Musculoskeletal:     Cervical back: Normal range of motion and neck supple.     Right lower leg: No edema.     Left lower leg: No edema.  Skin:    General: Skin is warm.     Findings: No rash.  Neurological:     Mental Status: She is alert.     Comments: Alert and oriented x3.  GCS  15.  Cranial nerves II through XII are grossly intact.  Answers questions appropriately.  Speech is not dysarthric.  Psychiatric:        Attention and Perception: She does not perceive auditory or visual hallucinations.        Mood and Affect: Affect is tearful.        Behavior: Behavior normal.        Thought Content: Thought content does not include homicidal or suicidal ideation. Thought content does not include homicidal or suicidal plan.     ED Results / Procedures / Treatments   Labs (all labs ordered are listed, but only abnormal results are displayed) Labs Reviewed - No data to display  EKG EKG Interpretation  Date/Time:  Saturday October 10 2020 23:43:28 EST Ventricular Rate:  73 PR Interval:    QRS Duration: 87 QT Interval:  408 QTC Calculation: 450 R Axis:   3 Text Interpretation: Sinus rhythm Low voltage, extremity and precordial leads When compared with ECG of 07/12/2019, No significant change was found Confirmed by Dione Booze ([id number]) on 10/10/2020 11:59:17 PM   Radiology No results found.  Procedures Procedures   Medications Ordered in ED Medications  acyclovir (ZOVIRAX) 200 MG capsule 400 mg (400 mg Oral Given 10/11/20 5701)    ED Course   I have reviewed the triage vital signs and the nursing notes.  Pertinent labs & imaging results that were available during my care of the patient were reviewed by me and considered in my medical decision making (see chart for details).    MDM Rules/Calculators/A&P                          50 year old female with a history of hypothyroidism, HTN, GERD, anxiety, and depression who presents to the emergency department by EMS with a chief complaint of overdose.  EMS reports that the patient took between 4 and 12 tablets of her prescription of her home 0.5 mg clonazepam earlier tonight with EtOH on board.  Poison control was contacted by RN who recommended 6-hour hour observation from time of ingestion and supplemental oxygen if needed.  No other recommendations from poison control.  Following 6-hour observation, case was closed with poison control.  Vital signs have been stable.  Patient has been tolerating fluids.  I evaluated the patient following the conclusion of her 6-hour observation window.  GCS 15.  She does not appear clinically intoxicated.  She is tearful, but exam is otherwise unremarkable.  She does have a developing HSV lesion to the upper lip.  Patient adamantly denies SI, HI, or auditory visual hallucinations.  States that this was unintentional and she just wanted to go to sleep after having an argument with her husband.  She also reports that she does feel safe at home at this time.  Will initiate acyclovir for HSV lesion.  The patient does report that she had a fall 2 days ago and has a residual nodule that she is currently taking doxycycline for.  No changes at this time.  However, I will give her a referral to plastic surgery if her symptoms do not resolve or if she has concerns about the cosmetic appearance of her wounds from her fall earlier this week.  At this time, the patient has medically cleared.  She is in no acute distress.  We will also give referral to [facility].  ER  return precautions given.  She is hemodynamically stable no acute distress.  Safe  discharge to home with outpatient follow-up as needed.   Final Clinical Impression(s) / ED Diagnoses Final diagnoses:  Accidental clonazepam overdose, initial encounter    Rx / DC Orders ED Discharge Orders         Ordered    acyclovir (ZOVIRAX) 400 MG tablet  4 times daily        10/11/20 0629           Frederik Pear A, PA-C 10/11/20 0815    Dione Booze, MD 10/12/20 0200

## 2020-10-11 NOTE — ED Notes (Signed)
Poison control closed case. 

## 2021-10-04 IMAGING — CT CT MAXILLOFACIAL W/O CM
3 of 4 series · 15 of 47 positions shown, 18 images · non-contrast
Comparison: CT head report dated August 17, 2009.

CLINICAL DATA: Tripped and fell yesterday. Injured right side of
her face. Headache.

EXAM:
CT HEAD WITHOUT CONTRAST
CT MAXILLOFACIAL WITHOUT CONTRAST
CT CERVICAL SPINE WITHOUT CONTRAST
TECHNIQUE: Multidetector CT imaging of the head, cervical spine, and
maxillofacial structures were performed using the standard protocol
without intravenous contrast. Multiplanar CT image reconstructions
of the cervical spine and maxillofacial structures were also
generated.

[Series 2: max soft · axial · 0.32mm/px · z∈[-233,-89]mm · 10 of 84 slices shown, 13 images]
[im 6/84  brain]
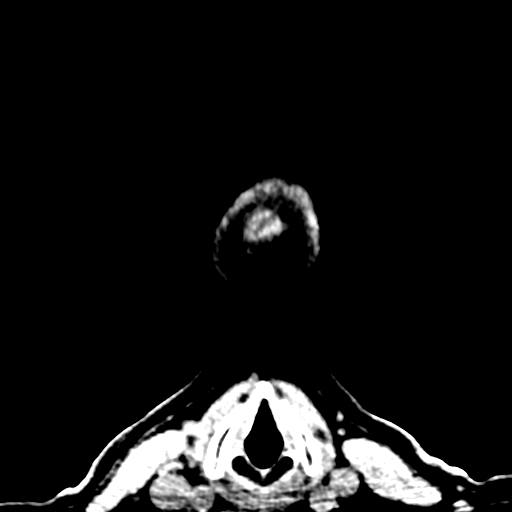
[im 6/84  bone]
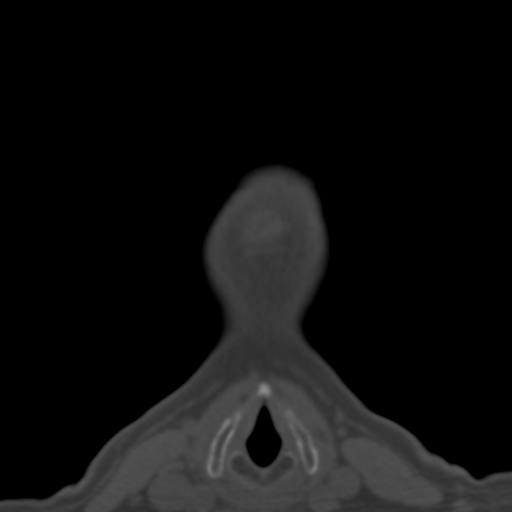
[im 15/84  bone]
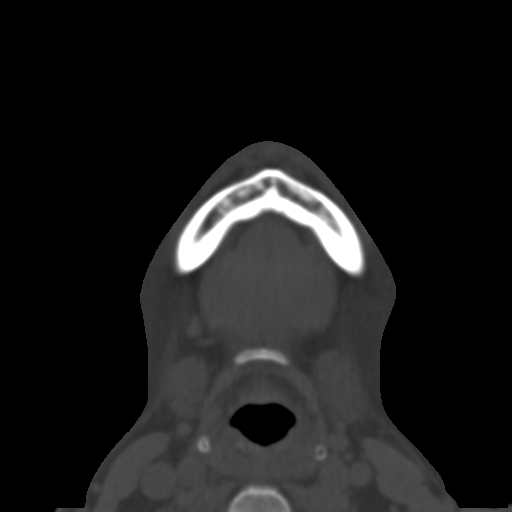
[im 23/84  bone]
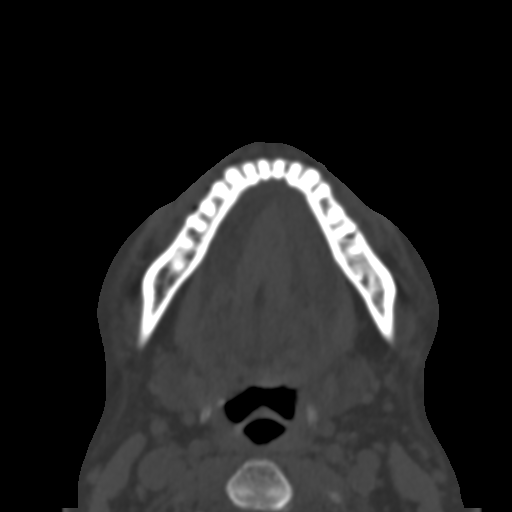
[im 29/84  bone]
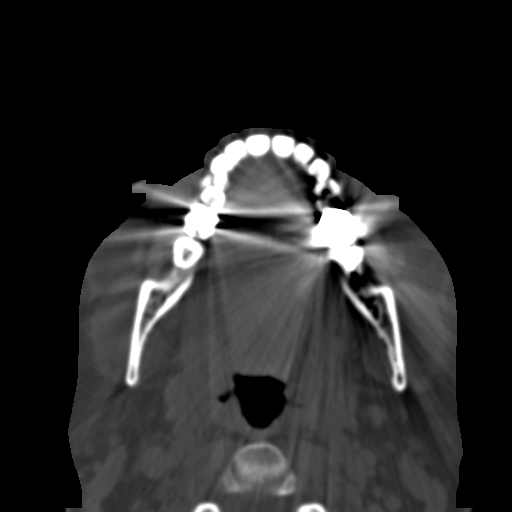
[im 38/84  brain]
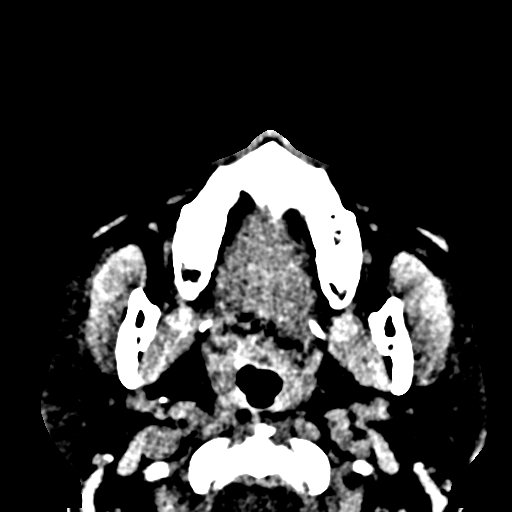
[im 38/84  bone]
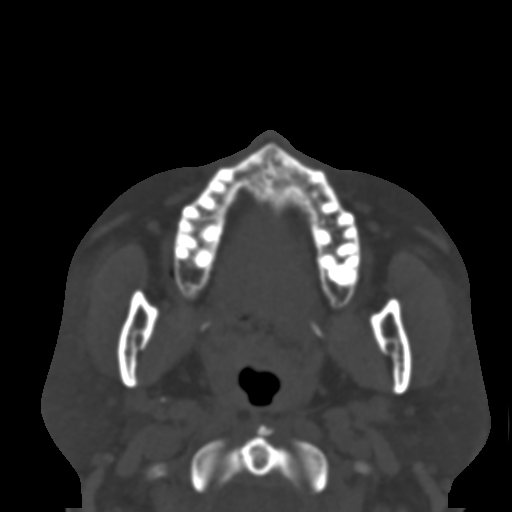
[im 46/84  bone]
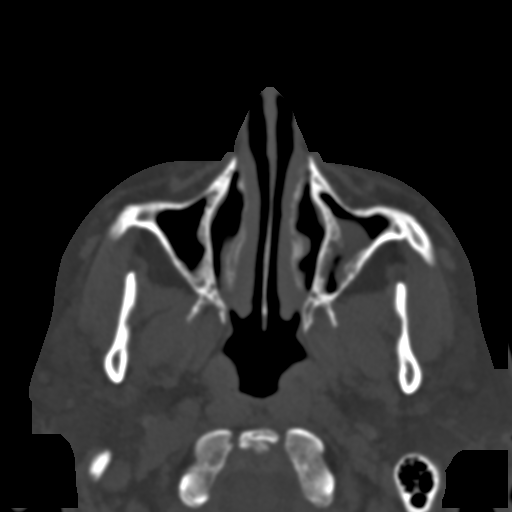
[im 55/84  bone]
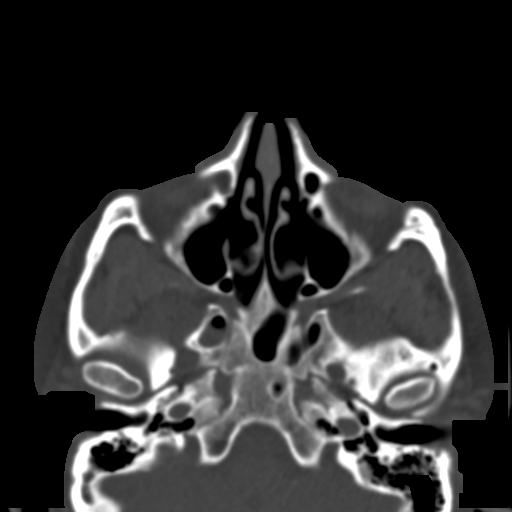
[im 63/84  bone]
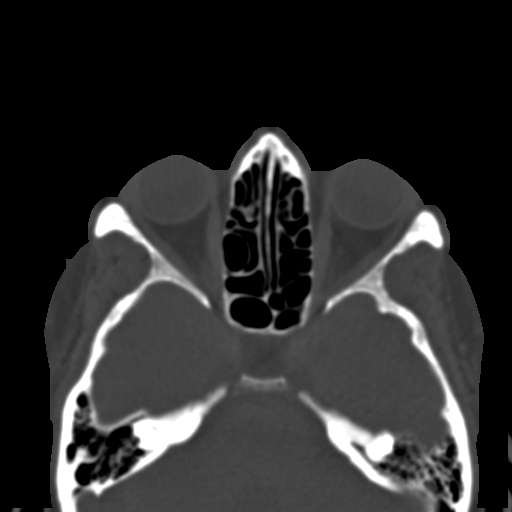
[im 69/84  brain]
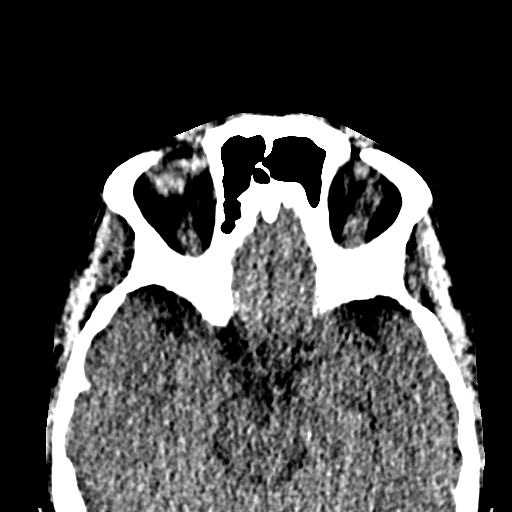
[im 69/84  bone]
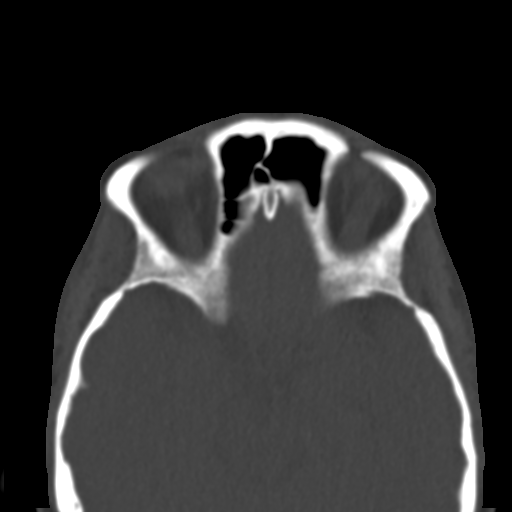
[im 78/84  bone]
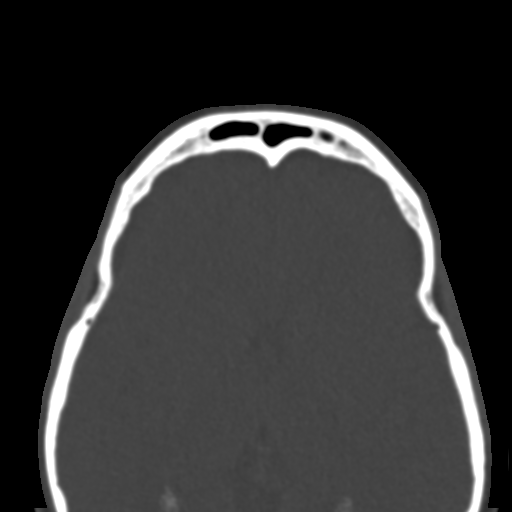

[Series 4: coronal soft · coronal · 0.34mm/px · 3 of 74 slices shown]
[im 25/74  bone]
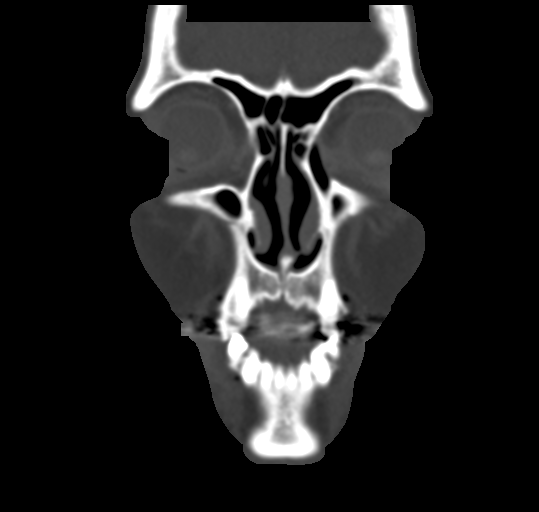
[im 33/74  bone]
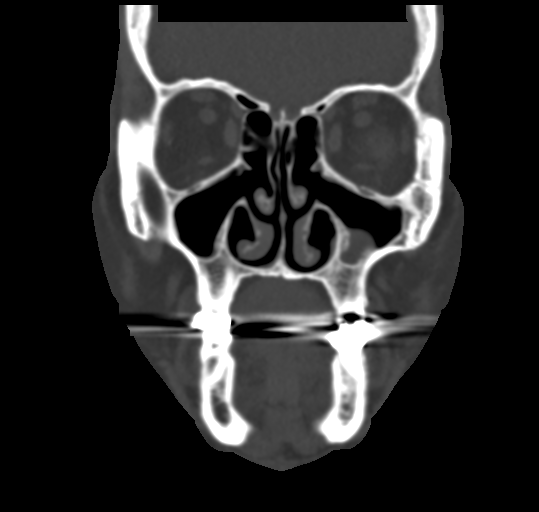
[im 41/74  bone]
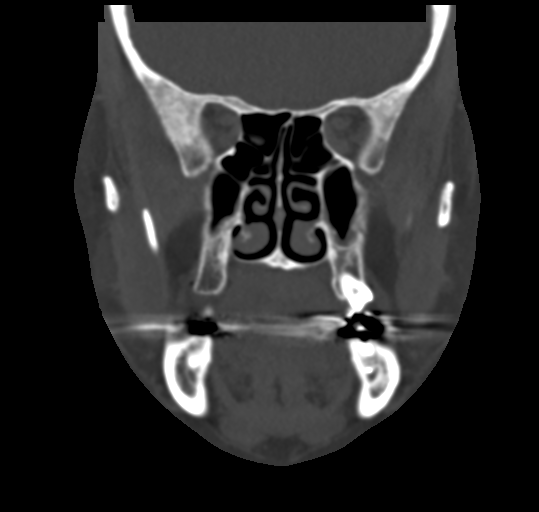

[Series 7: sagittal bone · sagittal · 0.32mm/px · 2 of 85 slices shown]
[im 29/85  bone]
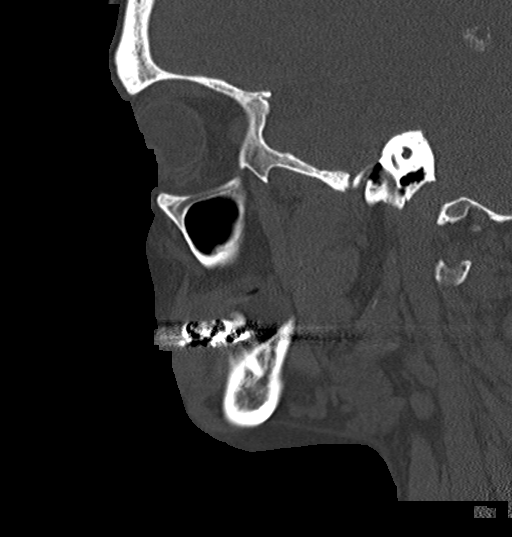
[im 57/85  bone]
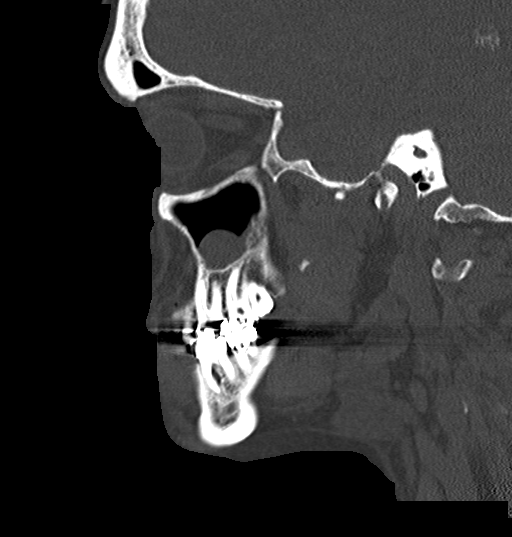

[15 of 47 positions shown; findings below may reference images not displayed]

FINDINGS: CT HEAD FINDINGS

Brain: No evidence of acute infarction, hemorrhage, hydrocephalus,
extra-axial collection or mass lesion/mass effect. Empty sella.

Vascular: No hyperdense vessel or unexpected calcification.

Skull: Normal. Negative for fracture or focal lesion.

Other: None.

CT MAXILLOFACIAL FINDINGS

Osseous: No fracture or mandibular dislocation. No destructive
process.

Orbits: Negative. No traumatic or inflammatory finding.

Sinuses: Small air-fluid level in the left sphenoid sinus. Frothy
secretions in the right sphenoid sinus. Retention cyst in the left
maxillary sinus.

Soft tissues: Negative.

CT CERVICAL SPINE FINDINGS

Alignment: No traumatic malalignment.

Skull base and vertebrae: No acute fracture. No primary bone lesion
or focal pathologic process.

Soft tissues and spinal canal: No prevertebral fluid or swelling. No
visible canal hematoma.

Disc levels: Mild to moderate disc height loss and uncovertebral
hypertrophy from C4-C5 through C6-C7.

Upper chest: Negative.

Other: None.
IMPRESSION: 1. No acute intracranial abnormality.
2. No acute maxillofacial fracture.
3. No acute cervical spine fracture or traumatic listhesis.

## 2021-10-04 IMAGING — CT CT HEAD W/O CM
3 series · 15 of 47 positions shown, 18 images · non-contrast
Comparison: CT head report dated August 17, 2009.

CLINICAL DATA: Tripped and fell yesterday. Injured right side of
her face. Headache.

EXAM:
CT HEAD WITHOUT CONTRAST
CT MAXILLOFACIAL WITHOUT CONTRAST
CT CERVICAL SPINE WITHOUT CONTRAST
TECHNIQUE: Multidetector CT imaging of the head, cervical spine, and
maxillofacial structures were performed using the standard protocol
without intravenous contrast. Multiplanar CT image reconstructions
of the cervical spine and maxillofacial structures were also
generated.

[Series 2: head wo · axial · 0.46mm/px · z∈[-137,-12]mm · 9 of 30 slices shown, 12 images]
[im 3/30  brain]
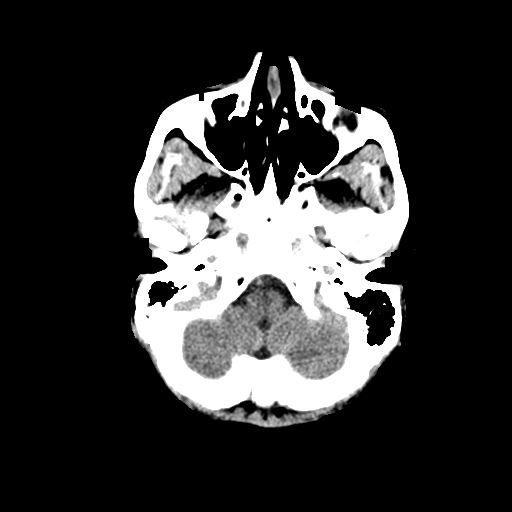
[im 3/30  bone]
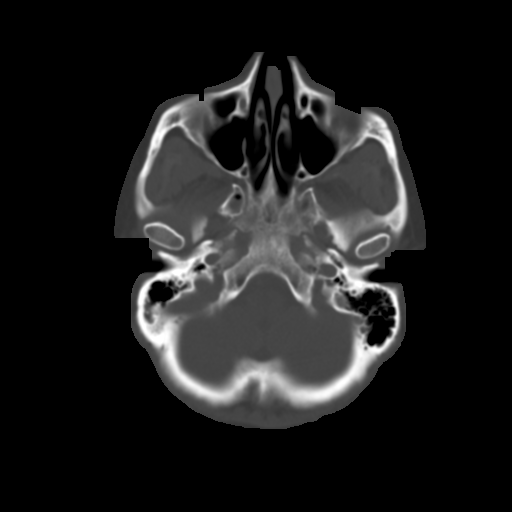
[im 6/30  brain]
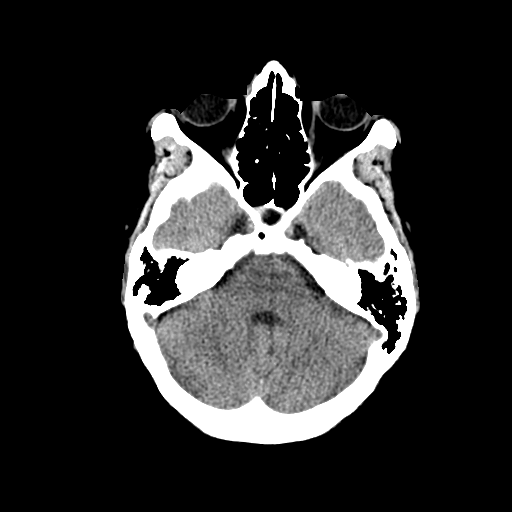
[im 9/30  brain]
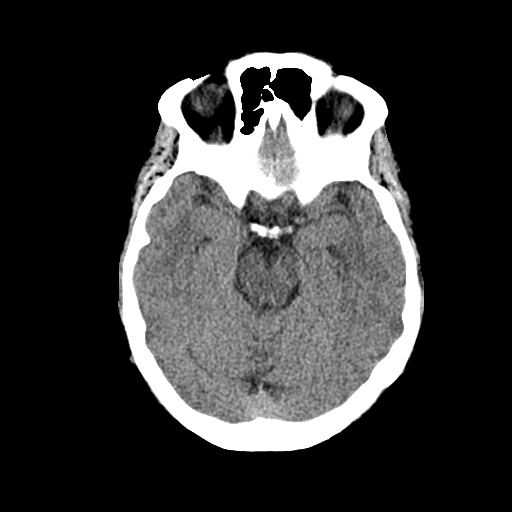
[im 12/30  brain]
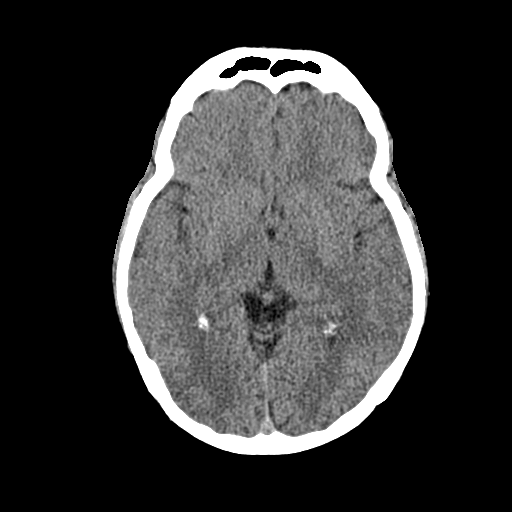
[im 16/30  brain]
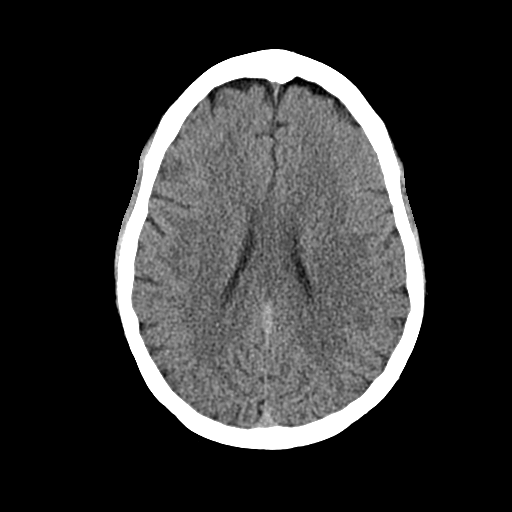
[im 16/30  bone]
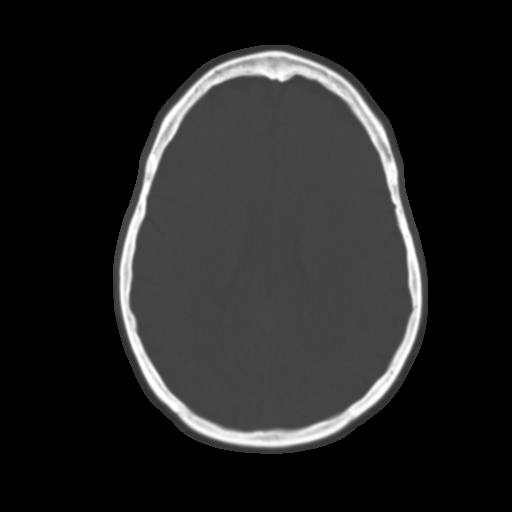
[im 19/30  brain]
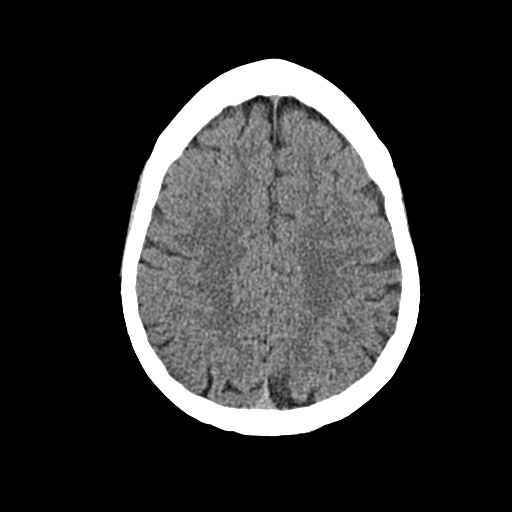
[im 22/30  brain]
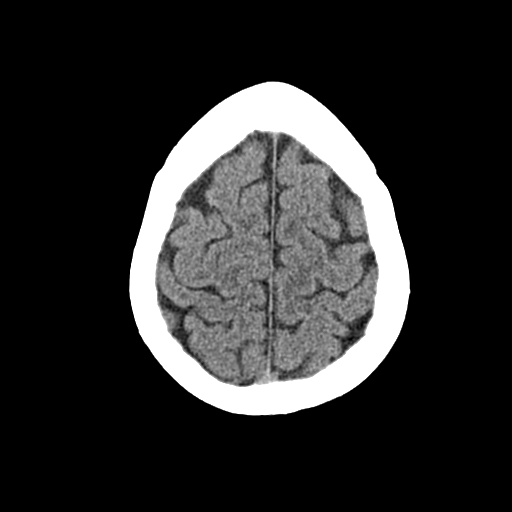
[im 25/30  brain]
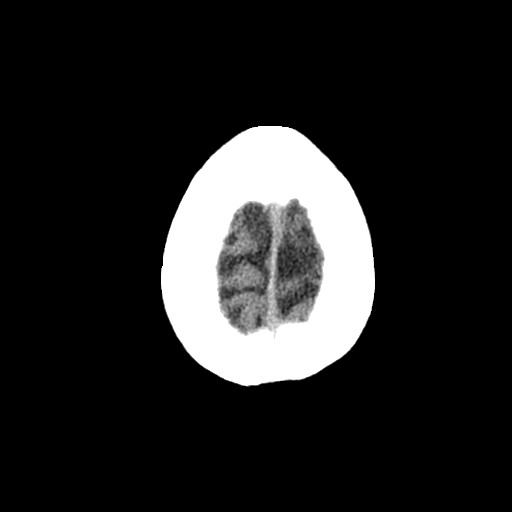
[im 28/30  brain]
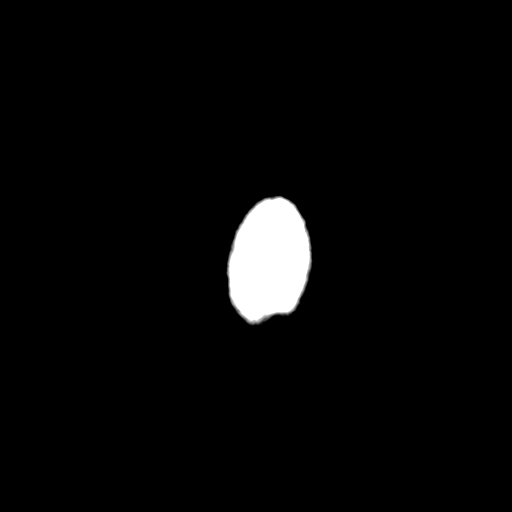
[im 28/30  bone]
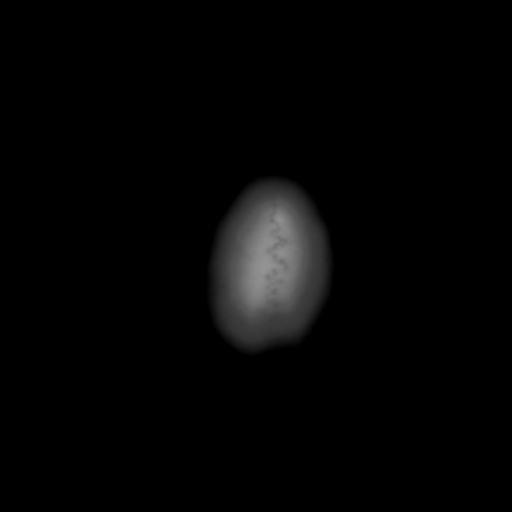

[Series 4: head coronal · coronal · 0.30mm/px · 3 of 67 slices shown]
[im 23/67  brain]
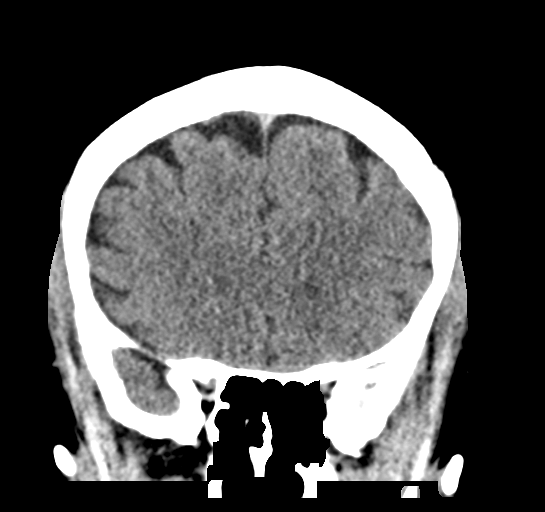
[im 30/67  brain]
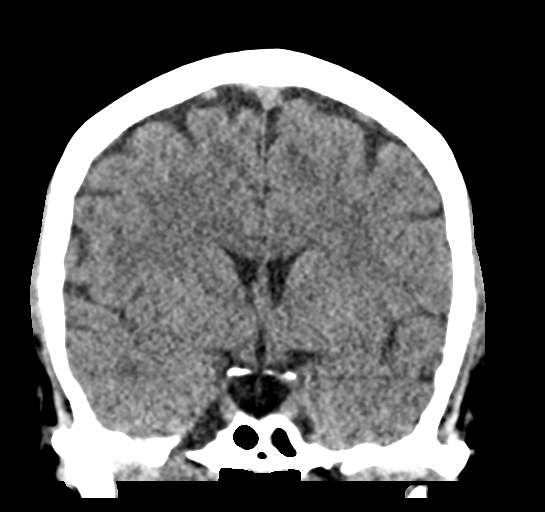
[im 37/67  brain]
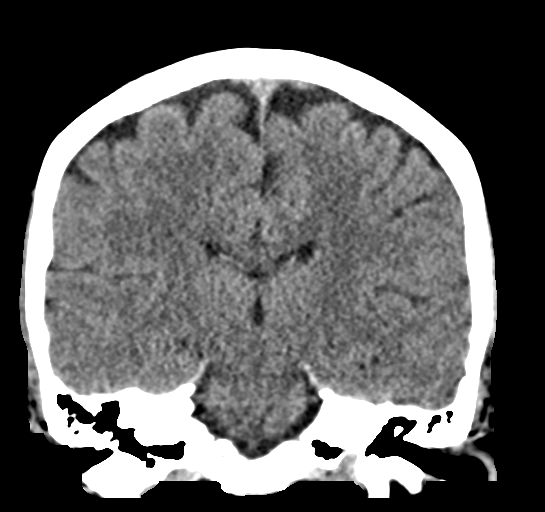

[Series 5: head sagittal · sagittal · 0.30mm/px · 3 of 55 slices shown]
[im 19/55  brain]
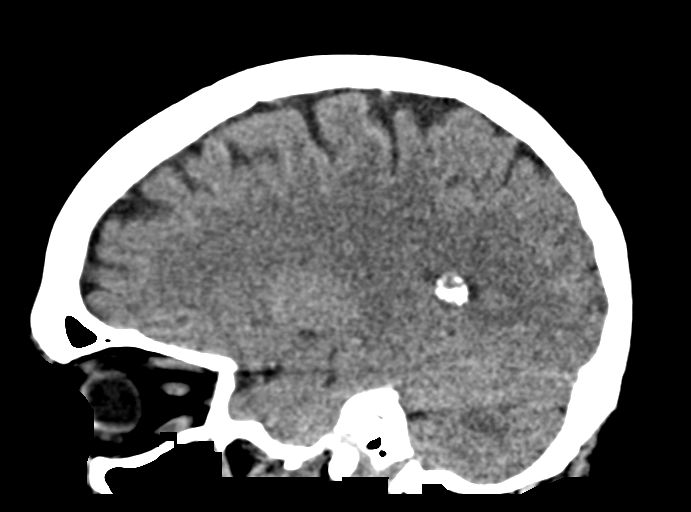
[im 28/55  brain]
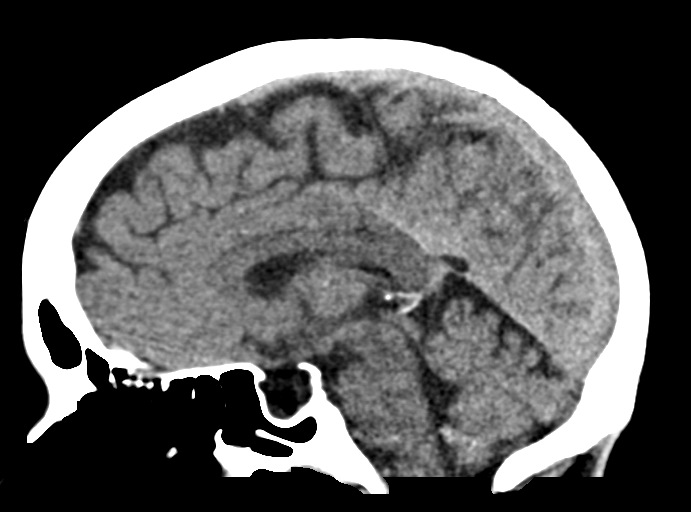
[im 37/55  brain]
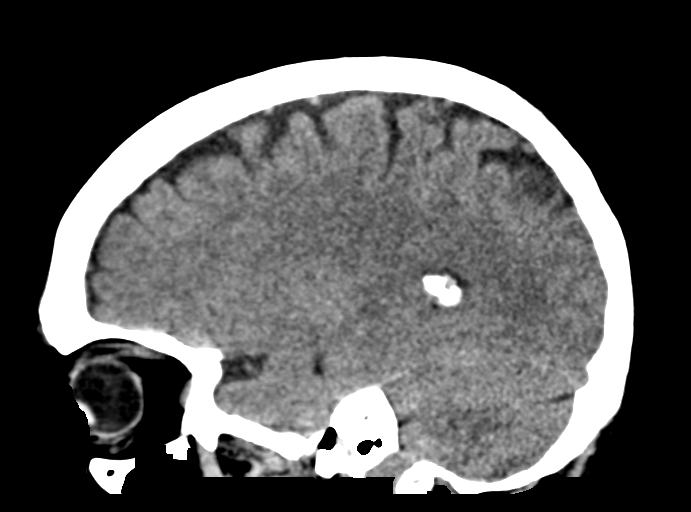

[15 of 47 positions shown; findings below may reference images not displayed]

FINDINGS: CT HEAD FINDINGS

Brain: No evidence of acute infarction, hemorrhage, hydrocephalus,
extra-axial collection or mass lesion/mass effect. Empty sella.

Vascular: No hyperdense vessel or unexpected calcification.

Skull: Normal. Negative for fracture or focal lesion.

Other: None.

CT MAXILLOFACIAL FINDINGS

Osseous: No fracture or mandibular dislocation. No destructive
process.

Orbits: Negative. No traumatic or inflammatory finding.

Sinuses: Small air-fluid level in the left sphenoid sinus. Frothy
secretions in the right sphenoid sinus. Retention cyst in the left
maxillary sinus.

Soft tissues: Negative.

CT CERVICAL SPINE FINDINGS

Alignment: No traumatic malalignment.

Skull base and vertebrae: No acute fracture. No primary bone lesion
or focal pathologic process.

Soft tissues and spinal canal: No prevertebral fluid or swelling. No
visible canal hematoma.

Disc levels: Mild to moderate disc height loss and uncovertebral
hypertrophy from C4-C5 through C6-C7.

Upper chest: Negative.

Other: None.
IMPRESSION: 1. No acute intracranial abnormality.
2. No acute maxillofacial fracture.
3. No acute cervical spine fracture or traumatic listhesis.
